# Patient Record
Sex: Male | Born: 1945 | Race: White | Hispanic: No | Marital: Married | Smoking: Former smoker
Health system: Southern US, Community
[De-identification: ages and names within clinical notes are randomized; demographics above are authoritative.]

## PROBLEM LIST (undated history)

## (undated) DIAGNOSIS — J84112 Idiopathic pulmonary fibrosis: Secondary | ICD-10-CM

## (undated) DIAGNOSIS — I10 Essential (primary) hypertension: Secondary | ICD-10-CM

## (undated) DIAGNOSIS — I4891 Unspecified atrial fibrillation: Secondary | ICD-10-CM

## (undated) DIAGNOSIS — I2510 Atherosclerotic heart disease of native coronary artery without angina pectoris: Secondary | ICD-10-CM

## (undated) HISTORY — PX: COLONOSCOPY: SHX174

## (undated) HISTORY — PX: UVULECTOMY: SHX2631

## (undated) HISTORY — PX: FOOT SURGERY: SHX648

## (undated) HISTORY — PX: CORONARY ANGIOPLASTY WITH STENT PLACEMENT: SHX49

## (undated) HISTORY — PX: RHINOPLASTY: SUR1284

## (undated) HISTORY — PX: BACK SURGERY: SHX140

## (undated) HISTORY — PX: VASECTOMY: SHX75

---

## 2004-06-20 ENCOUNTER — Encounter: Admission: RE | Admit: 2004-06-20 | Discharge: 2004-06-20 | Payer: Self-pay | Admitting: Otolaryngology

## 2010-03-22 DIAGNOSIS — I252 Old myocardial infarction: Secondary | ICD-10-CM

## 2010-06-16 ENCOUNTER — Emergency Department (HOSPITAL_BASED_OUTPATIENT_CLINIC_OR_DEPARTMENT_OTHER): Admission: EM | Admit: 2010-06-16 | Discharge: 2010-06-16 | Payer: Self-pay | Admitting: Emergency Medicine

## 2010-06-16 ENCOUNTER — Ambulatory Visit: Payer: Self-pay | Admitting: Diagnostic Radiology

## 2011-01-06 LAB — CBC
HCT: 48.1 % (ref 39.0–52.0)
MCH: 31.1 pg (ref 26.0–34.0)
MCHC: 33.8 g/dL (ref 30.0–36.0)
MCV: 92.1 fL (ref 78.0–100.0)
Platelets: 156 10*3/uL (ref 150–400)
RBC: 5.23 MIL/uL (ref 4.22–5.81)

## 2011-01-06 LAB — COMPREHENSIVE METABOLIC PANEL
Alkaline Phosphatase: 74 U/L (ref 39–117)
BUN: 20 mg/dL (ref 6–23)
CO2: 28 mEq/L (ref 19–32)
Chloride: 104 mEq/L (ref 96–112)
Creatinine, Ser: 1.1 mg/dL (ref 0.4–1.5)
GFR calc non Af Amer: >60 mL/min (ref >60)
Potassium: 3.9 mEq/L (ref 3.5–5.1)
Total Protein: 7.6 g/dL (ref 6.0–8.3)

## 2011-01-06 LAB — POCT CARDIAC MARKERS: Troponin i, poc: 0.06 ng/mL (ref 0.00–0.09)

## 2019-12-14 ENCOUNTER — Ambulatory Visit: Payer: Medicare Other | Attending: Internal Medicine

## 2019-12-14 DIAGNOSIS — Z23 Encounter for immunization: Secondary | ICD-10-CM

## 2019-12-14 NOTE — Progress Notes (Signed)
   Covid-19 Vaccination Clinic  Name:  Roger Sims    MRN: [id number] DOB: Apr 02, 1946  12/14/2019  Mr. Roger Sims was observed post Covid-19 immunization for 15 minutes without incidence. He was provided with Vaccine Information Sheet and instruction to access the V-Safe system.   Mr. Roger Sims was instructed to call 911 with any severe reactions post vaccine: Marland Kitchen Difficulty breathing  . Swelling of your face and throat  . A fast heartbeat  . A bad rash all over your body  . Dizziness and weakness    Immunizations Administered    Name Date Dose VIS Date Route   Pfizer COVID-19 Vaccine 12/14/2019  9:11 AM 0.3 mL 10/03/2019 Intramuscular   Manufacturer: [place]   Lot: [id number]   [place]: [id number]

## 2020-01-06 ENCOUNTER — Ambulatory Visit: Payer: Medicare Other | Attending: Internal Medicine

## 2020-01-06 DIAGNOSIS — Z23 Encounter for immunization: Secondary | ICD-10-CM

## 2020-01-06 NOTE — Progress Notes (Signed)
   Covid-19 Vaccination Clinic  Name:  Roger Sims    MRN: [id number] DOB: November 29, 1945  01/06/2020  [name] was observed post Covid-19 immunization for 15 minutes without incident. He was provided with Vaccine Information Sheet and instruction to access the V-Safe system.   [name] was instructed to call 911 with any severe reactions post vaccine: Marland Kitchen Difficulty breathing  . Swelling of face and throat  . A fast heartbeat  . A bad rash all over body  . Dizziness and weakness   Immunizations Administered    Name Date Dose VIS Date Route   Pfizer COVID-19 Vaccine 01/06/2020  3:51 PM 0.3 mL 10/03/2019 Intramuscular   Manufacturer: [place]   Lot: [id number]   [place]: [id number]

## 2020-07-08 ENCOUNTER — Ambulatory Visit: Payer: Medicare Other | Admitting: Internal Medicine

## 2020-07-08 ENCOUNTER — Encounter: Payer: Self-pay | Admitting: Internal Medicine

## 2020-07-08 ENCOUNTER — Other Ambulatory Visit: Payer: Self-pay

## 2020-07-08 ENCOUNTER — Other Ambulatory Visit: Payer: Self-pay | Admitting: Internal Medicine

## 2020-07-08 ENCOUNTER — Telehealth: Payer: Self-pay | Admitting: Internal Medicine

## 2020-07-08 ENCOUNTER — Telehealth: Payer: Self-pay | Admitting: Pharmacy Technician

## 2020-07-08 VITALS — BP 120/68 | HR 70 | Temp 96.2°F | Ht 72.0 in | Wt 171.8 lb

## 2020-07-08 DIAGNOSIS — J439 Emphysema, unspecified: Secondary | ICD-10-CM

## 2020-07-08 DIAGNOSIS — J84112 Idiopathic pulmonary fibrosis: Secondary | ICD-10-CM

## 2020-07-08 DIAGNOSIS — R634 Abnormal weight loss: Secondary | ICD-10-CM

## 2020-07-08 DIAGNOSIS — J9611 Chronic respiratory failure with hypoxia: Secondary | ICD-10-CM | POA: Diagnosis not present

## 2020-07-08 LAB — SEDIMENTATION RATE: Sed Rate: 15 mm/hr (ref 0–20)

## 2020-07-08 MED ORDER — SPIRIVA RESPIMAT 1.25 MCG/ACT IN AERS: 2.0000 | INHALATION_SPRAY | Freq: Every day | RESPIRATORY_TRACT | 0 refills | Status: DC

## 2020-07-08 NOTE — Telephone Encounter (Signed)
Forgot to prescrube him Spiriva Respimat 2 puffs once daily for associated emphysema

## 2020-07-08 NOTE — Progress Notes (Addendum)
OV 07/08/2020  Subjective:  Patient ID: Roger Sims, male , DOB: 05-Nov-1945 , age 74 y.o. , MRN: [id number] , ADDRESS: [address] [place] [address]   07/08/2020 -   Chief Complaint  Patient presents with  . Follow-up    pt is here for 2nd opinion losing weight.pt has pain left side .pt has issue with lungs.pt has extreme sob before 0xygen     HPI Roger Sims 74 y.o. -presents to th [place] center.  History is provided by him and his wife and review of the chart.  1 year ago he started losing weight.  Since then he is lost 20+ pounds.  Some 8 months ago started getting worsening chronic cough.  He is known to have chronic cough and sinus issues.  In 6 months ago worsening shortness of breath with insidious onset.  Then in June 2021/July 2021 he started having right-sided infra axillary pain.  At that time gallbladder issues was investigated a chest x-ray resulted in a CT scan of the chest which showed ILD.  He was empirically commenced on oxygen.  They feel is progressively getting worse.  Wife also noticed memory issues the last 1 year.  After starting the oxygen his memory issues started getting better but now is getting worse again.  After the CT scan of the chest was done ILD was diagnosed.  He was then asked to go see pulmonary specialist at [facility].  However his neighbor Shaune Leeks -suffers from IPF.  He is part of the support group.  He sees Dr. Dorothyann Peng at [facility].  This patient is now on hospice care and recommended patient see me in the for the patient is here at the [facility] health ILD center.    Integrated Comprehensive ILD Questionnaire  Symptoms:  As abiove and below.  He also has right-sided chest pain in the infra axillary region it is sharp.  Is present in the mornings.  It is there with movement and drinking water.  Slowly getting better since his original abrupt onset.  SYMPTOM SCALE - ILD 07/08/2020   O2 use RA  Shortness of Breath 0 -> 5  scale with 5 being worst (score 6 If unable to do)  At rest 1  Simple tasks - showers, clothes change, eating, shaving 2.5  Household (dishes, doing bed, laundry) NA  Shopping 2.5  Walking level at own pace 2.5  Walking up Stairs 3  Total (30-36) Dyspnea Score 11  How bad is your cough? 2.5  How bad is your fatigue 3  How bad is nausea 0  How bad is vomiting?  0  How bad is diarrhea? 0  How bad is anxiety? 2  How bad is depression 2.5      Past Medical History : Positive history for COPD this was diagnosed in the CT scan of the chest.  I reviewed the medical records.  There is mention.  Is not on any treatment for this.  He reported positive for mild kidney disease for the last few years.  He had a heart attack in 2011 and has a coronary artery stent.  Otherwise negative for collagen vascular disease or tuberculosis or vasculitis.   ROS: Positive for fatigue.  Positive for sicca symptoms.  Positive for 20 pound weight loss in the last several months.  Positive for excessive tiredness   FAMILY HISTORY of LUNG DISEASE: Completely denies   EXPOSURE HISTORY: Smokes cigarettes between 1962 and  1982.  20 cigarettes/day.  Currently not a smoker.  Does not smoke cigars.  No smoking pipes.  No passive smoking.  No vaping.  No marijuana use no cocaine use no intravenous drug use.   HOME and HOBBY DETAILS : Currently lives in a townhouse for the last 7 years.  Age of the townhome for 7 years.  Negative history for any organic antigen exposure in the house.  No dampness.  No mildew or shower curtain.  No humidifier.  No CPAP use no nebulizer use.  No steam iron use.  No Jacuzzi use.  No misting Fountain.  No pet birds or parakeets.  No pet gerbils.  No mold in the [facility] duct.  No music habits.  No gardening habits.   OCCUPATIONAL HISTORY (122 questions) : Exposure history for organic antigens he worked in [employer] he was in the [place] [employer].  He did airplane work and Furniture conservator/restorer work and.   While in the [place] was also exposed to gas fumes and chemicals.  Otherwise history is negative.   PULMONARY TOXICITY HISTORY (27 items): Denies.      CXR 2011  Narrative  Clinical Data: Chest pain and heaviness, right upper extremity  numbness, back pain.    PORTABLE CHEST - 1 VIEW 06/16/2010 1610 hours:    Comparison: None.    Findings: Moderate enlargement of the cardiac silhouette. Thoracic  aorta tortuous. Hilar and mediastinal contours otherwise  unremarkable. Lungs clear. Mild pulmonary venous hypertension  without overt edema. No visible pleural effusions. Prominent  paracardiac fat pad on the left.    IMPRESSION:  Cardiomegaly. No acute cardiopulmonary disease.   Provider: Karl Bales   He had a CT scan of the chest at [facility].  I was able to get our radiologist to visualize the CT scan.  They email me back.  Especially Dr. Madie Reno.  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  There does appear to be a mild craniocaudal gradient.  My impression would be "compatible with UIP" per current ATS guidelines.     Dan  ROS - per HPI     has no past medical history on file.   reports that he has quit smoking. His smoking use included cigarettes. He smoked 1.50 packs per day. He has quit using smokeless tobacco.   The histories are not reviewed yet. Please review them in the "History" navigator section and refresh this [place].  Allergies  Allergen Reactions  . Penicillins Swelling  . Tetracyclines & Related Anaphylaxis    Gi upset Gi upset      Immunization History  Administered Date(s) Administered  . PFIZER SARS-COV-2 Vaccination 12/14/2019, 01/06/2020    No family history on file.   Current Outpatient Medications:  .  latanoprost (XALATAN) 0.005 % ophthalmic solution, , Disp: , Rfl:  .  nebivolol (BYSTOLIC) 10 MG tablet, Take 1 tablet by mouth daily., Disp: , Rfl:  .  aspirin 81 MG EC tablet, Take by mouth., Disp: , Rfl:       Objective:   Vitals:   07/08/20 1034  BP: 120/68  Pulse: 70  Temp: (!) 96.2 F (35.7 C)  TempSrc: Oral  SpO2: 93%  Weight: 171 lb 12.8 oz (77.9 kg)  Height: 6' (1.829 m)    Estimated body mass index is 23.3 kg/m as calculated from the following:   Height as of this encounter: 6' (1.829 m).   Weight as of this encounter: 171 lb 12.8 oz (77.9 kg).  _0 @  Filed Weights   07/08/20 1034  Weight: 171 lb 12.8 oz (77.9 kg)     Physical Exam  General Appearance:    Alert, cooperative, no distress, appears stated age - yes , Deconditioned looking - yes , OBESE  - no, Sitting on Wheelchair -  no  Head:    Normocephalic, without obvious abnormality, atraumatic  Eyes:    PERRL, conjunctiva/corneas clear,  Ears:    Normal TM's and external ear canals, both ears  Nose:   Nares normal, septum midline, mucosa normal, no drainage    or sinus tenderness. OXYGEN ON  - yes . Patient is @ 3L   Throat:   Lips, mucosa, and tongue normal; teeth and gums normal. Cyanosis on lips - no  Neck:   Supple, symmetrical, trachea midline, no adenopathy;    thyroid:  no enlargement/tenderness/nodules; no carotid   bruit or JVD  Back:     Symmetric, no curvature, ROM normal, no CVA tenderness  Lungs:     Distress - no , Wheeze no, Barrell Chest - no, Purse lip breathing - no, Crackles -classic Velcro crackles in the lung base with a craniocaudal gradient  Chest Wall:    No tenderness or deformity.    Heart:    Regular rate and rhythm, S1 and S2 normal, no rub   or gallop, Murmur - no  Breast  Exam:    NOT DONE  Abdomen:     Soft, non-tender, bowel sounds active all four quadrants,    no masses, no organomegaly. Visceral obesity - no  Genitalia:   NOT DONE  Rectal:   NOT DONE  Extremities:   Extremities - normal, Has Cane - no, Clubbing - YES, Edema - no  Pulses:   2+ and symmetric all extremities  Skin:   Stigmata of Connective Tissue Disease - no  Lymph nodes:   Cervical, supraclavicular, and axillary nodes normal  Psychiatric:  Neurologic:   Pleasant - yes, Anxious - no, Flat affect - no  CAm-ICU - neg, Alert and Oriented x 3 - yes, Moves all 4s - yes, Speech - normal, Cognition - intact           Assessment:       ICD-10-CM   1. IPF (idiopathic pulmonary fibrosis) (HCC)  J84.112 Sed Rate (ESR)    Angiotensin converting enzyme    Antinuclear Antib (ANA)    ANA+ENA+DNA/DS+Scl 70+SjoSSA/B    Rheumatoid Factor    Cyclic citrul peptide antibody, IgG    CK Total (and CKMB)    Aldolase    Angi-Jo 1 antibody, IgG    Anti-Smith antibody    Hypersensitivity Pneumonitis    AMB referral to pulmonary rehabilitation    Hypersensitivity Pneumonitis    Anti-Smith antibody    Angi-Jo 1 antibody, IgG    Aldolase    CK Total (and CKMB)    Cyclic citrul peptide antibody, IgG    Rheumatoid Factor    ANA+ENA+DNA/DS+Scl 70+SjoSSA/B    Antinuclear Antib (ANA)    Angiotensin converting enzyme    Sed Rate (ESR)  2. Chronic respiratory failure with hypoxia (HCC)  J96.11 Sed Rate (ESR)    Angiotensin  converting enzyme    Antinuclear Antib (ANA)    ANA+ENA+DNA/DS+Scl 70+SjoSSA/B    Rheumatoid Factor    Cyclic citrul peptide antibody, IgG    CK Total (and CKMB)    Aldolase    Angi-Jo 1 antibody, IgG    Anti-Smith antibody    Hypersensitivity Pneumonitis    AMB referral to pulmonary rehabilitation    Hypersensitivity Pneumonitis    Anti-Smith antibody    Angi-Jo 1 antibody, IgG    Aldolase    CK Total (and CKMB)    Cyclic citrul peptide antibody, IgG    Rheumatoid  Factor    ANA+ENA+DNA/DS+Scl 70+SjoSSA/B    Antinuclear Antib (ANA)    Angiotensin converting enzyme    Sed Rate (ESR)  3. Weight loss, unintentional  R63.4 Sed Rate (ESR)    Angiotensin converting enzyme    Antinuclear Antib (ANA)    ANA+ENA+DNA/DS+Scl 70+SjoSSA/B    Rheumatoid Factor    Cyclic citrul peptide antibody, IgG    CK Total (and CKMB)    Aldolase    Angi-Jo 1 antibody, IgG    Anti-Smith antibody    Hypersensitivity Pneumonitis    AMB referral to pulmonary rehabilitation    Hypersensitivity Pneumonitis    Anti-Smith antibody    Angi-Jo 1 antibody, IgG    Aldolase    CK Total (and CKMB)    Cyclic citrul peptide antibody, IgG    Rheumatoid Factor    ANA+ENA+DNA/DS+Scl 70+SjoSSA/B    Antinuclear Antib (ANA)    Angiotensin converting enzyme    Sed Rate (ESR)   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  Previous cigarette smoking.  Male gender.  Caucasian ethnicity.  Age greater than 57.  Clubbing present.  Craniocaudal gradient Velcro crackles classic for UIP present.  No stigmata of connective tissue.  Based on all of this the diagnosis he has IPF especially with coexistent emphysema reported.     He has not had serology report.  He has had pulmonary function test with his reports are not available for me.  He had a CT scan of the chest but it was not read by our thoracic radiologist.  I am not able to visualize the image.  Despite all this is overwhelming pretest probably to hear his IPF.  Addendum: Radiologist got back saying the CT film is compatible with UIP.  Therefore this adds credence to IPF  Therefore we will go ahead and start treatment especially given his decline.  We discussed nintedanib versus pirfenidone.  Given his previous history of coronary artery disease even though he prefers diarrhea as a side effect compared to nausea we took a shared decision making to start pirfenidone.  We discussed the requirement for sunscreen frequently but  function test monitoring and monitoring for nausea and having to space out the drugs.  He is willing to do all this.  Plan:     Patient Instructions     ICD-10-CM   1. IPF (idiopathic pulmonary fibrosis) ([place])  J84.112   2. Chronic respiratory failure with hypoxia (HCC)  J96.11   3. Weight loss, unintentional  R63.4      Plan  -Sign release to get the CD-ROM of the high-resolution CT chest from [place] it with you at the next visit or drop it off -Spend 30 minutes and do the interstitial lung disease questionnaire today and drop it off at the front desk today itself - Do blood Serum: ESR, ACE, ANA, DS-DNA, RF,  anti-CCP, Total CK,  Aldolase,  scl-70, ssA, ssB, , anti-JO-1, anti-smith & Hypersensitivity Pneumonitis Panel  -Refer to pulmonary rehabilitation at [facility] medical center  - get in touch with Marlane Mingle at support group - [email or web address] - start ESBRIET per protocol - refer our pharmacist to counseling on esbreit - in future we discuss clinical trials -Continue oxygen 3 L nasal cannula and with exertion -try to keep pulse ox greater than 86% presents to the ILD center.  History is provided by wife, patient and also review of the chart.  It appears he has a lifelong history of chronic sinus issues and cough.  Then approximately 1 year ago he started losing weight.  Since then he is lost 20 pounds.  Followup  -Refer to see our pharmacist in the next few weeks -Return to see Dr. Chase Caller in the next 4-6 weeks and a 30-minute slot     ( Level 05 visit: Nw 60-74 min   in  visit type: on-site physical face to visit  in total care time and counseling or/and coordination of care by this undersigned MD - Dr Brand Males. This includes one or more of the following on this same day 07/08/2020: pre-charting, chart review, note writing, documentation discussion of test results, diagnostic or treatment recommendations, prognosis, risks and benefits of management  options, instructions, education, compliance or risk-factor reduction. It excludes time spent by the [place] or office staff in the care of the patient. Actual time 38 min)   SIGNATURE    Dr. Brand Males, M.D., F.C.C.P,  Pulmonary and Critical Care Medicine Staff Physician, [place] Director - Interstitial Lung Disease  Program  Pulmonary [place] at [place], [place], [address]  Pager: [phone], If no answer or between  15:00h - 7:00h: call [phone] Telephone: [phone]  2:41 PM 07/08/2020

## 2020-07-08 NOTE — Telephone Encounter (Signed)
thx

## 2020-07-08 NOTE — Telephone Encounter (Signed)
Pt spiriva respimat sent into pharmacy

## 2020-07-08 NOTE — Telephone Encounter (Signed)
Received notification from [facility] regarding a prior authorization for ESBRIET. Authorization has been APPROVED from 07/08/20 to 10/22/20.   Authorization # [id number]  Ran test claim for 1 month supply, patient's copay is $1,552.78. No pharmacy restrictions. No grants open at this time. Patient can apply for [employer] PAP. No paperwork has been received for patient.

## 2020-07-08 NOTE — Patient Instructions (Addendum)
ICD-10-CM   1. IPF (idiopathic pulmonary fibrosis) (HCC)  J84.112 Sed Rate (ESR)    Angiotensin converting enzyme    Antinuclear Antib (ANA)    ANA+ENA+DNA/DS+Scl 70+SjoSSA/B    Rheumatoid Factor    Cyclic citrul peptide antibody, IgG    CK Total (and CKMB)    Aldolase    Angi-Jo 1 antibody, IgG    Anti-Smith antibody    Hypersensitivity Pneumonitis    AMB referral to pulmonary rehabilitation    Hypersensitivity Pneumonitis    Anti-Smith antibody    Angi-Jo 1 antibody, IgG    Aldolase    CK Total (and CKMB)    Cyclic citrul peptide antibody, IgG    Rheumatoid Factor    ANA+ENA+DNA/DS+Scl 70+SjoSSA/B    Antinuclear Antib (ANA)    Angiotensin converting enzyme    Sed Rate (ESR)  2. Chronic respiratory failure with hypoxia (HCC)  J96.11 Sed Rate (ESR)    Angiotensin converting enzyme    Antinuclear Antib (ANA)    ANA+ENA+DNA/DS+Scl 70+SjoSSA/B    Rheumatoid Factor    Cyclic citrul peptide antibody, IgG    CK Total (and CKMB)    Aldolase    Angi-Jo 1 antibody, IgG    Anti-Smith antibody    Hypersensitivity Pneumonitis    AMB referral to pulmonary rehabilitation    Hypersensitivity Pneumonitis    Anti-Smith antibody    Angi-Jo 1 antibody, IgG    Aldolase    CK Total (and CKMB)    Cyclic citrul peptide antibody, IgG    Rheumatoid Factor    ANA+ENA+DNA/DS+Scl 70+SjoSSA/B    Antinuclear Antib (ANA)    Angiotensin converting enzyme    Sed Rate (ESR)  3. Weight loss, unintentional  R63.4 Sed Rate (ESR)    Angiotensin converting enzyme    Antinuclear Antib (ANA)    ANA+ENA+DNA/DS+Scl 70+SjoSSA/B    Rheumatoid Factor    Cyclic citrul peptide antibody, IgG    CK Total (and CKMB)    Aldolase    Angi-Jo 1 antibody, IgG    Anti-Smith antibody    Hypersensitivity Pneumonitis    AMB referral to pulmonary rehabilitation    Hypersensitivity Pneumonitis    Anti-Smith antibody    Angi-Jo 1 antibody, IgG    Aldolase    CK Total (and CKMB)    Cyclic citrul peptide antibody,  IgG    Rheumatoid Factor    ANA+ENA+DNA/DS+Scl 70+SjoSSA/B    Antinuclear Antib (ANA)    Angiotensin converting enzyme    Sed Rate (ESR)  4. Pulmonary emphysema, unspecified emphysema type (HCC)  J43.9      Plan  -Sign release to get the CD-ROM of the high-resolution CT chest from [place] it with you at the next visit or drop it off  -  Also written to a local radiologist if they consider imaging given your report.  -Spend 30 minutes and do the interstitial lung disease questionnaire today and drop it off at the front desk today itself - Do blood Serum: ESR, ACE, ANA, DS-DNA, RF, anti-CCP, Total CK,  Aldolase,  scl-70, ssA, ssB, , anti-JO-1, anti-smith & Hypersensitivity Pneumonitis Panel  -Refer to pulmonary rehabilitation at [facility] medical center  - get in touch with Marlane Mingle at support group - [email or web address] - start ESBRIET per protocol - refer our pharmacist to counseling on esbreit - in future we discuss clinical trials -Continue oxygen 3 L nasal cannula and with exertion -try to keep pulse ox greater than 86% presents to the ILD  center.  History is provided by wife, patient and also review of the chart.  It appears he has a lifelong history of chronic sinus issues and cough.  Then approximately 1 year ago he started losing weight.  Since then he is lost 20 pounds.   - will start spiriva - will call it in  Followup  -Refer to see our pharmacist in the next few weeks -Return to see Dr. Chase Caller in the next 4-6 weeks and a 30-minute slot

## 2020-07-08 NOTE — Telephone Encounter (Signed)
Submitted a Prior Authorization request to [facility] for [place] via Cover My Meds. Will update once we receive a response.   Key: [id number] - PA Case ID: [id number]

## 2020-07-09 ENCOUNTER — Telehealth: Payer: Self-pay | Admitting: Internal Medicine

## 2020-07-09 LAB — ANTI-SMITH ANTIBODY: ENA SM Ab Ser-aCnc: <1 AI

## 2020-07-09 NOTE — Telephone Encounter (Signed)
Patient has pharmacy visit scheduled for 9/28 @ 1:20pm

## 2020-07-09 NOTE — Telephone Encounter (Signed)
Spoke with the pt  He states that he is checking to be sure that ew sent off his release form for his disc of ct scan  [place]- you worked with MR 07/08/20 do you recall filling this out and faxing?

## 2020-07-12 LAB — CK TOTAL AND CKMB (NOT AT ARMC)
CK, MB: 3 ng/mL (ref 0–5.0)
Relative Index: 5.8 — ABNORMAL HIGH (ref 0–4.0)
Total CK: 52 U/L (ref 44–196)

## 2020-07-12 LAB — HYPERSENSITIVITY PNEUMONITIS
A. Pullulans Abs: NEGATIVE
A.Fumigatus #1 Abs: NEGATIVE
Micropolyspora faeni, IgG: NEGATIVE
Pigeon Serum Abs: NEGATIVE
Thermoact. Saccharii: NEGATIVE
Thermoactinomyces vulgaris, IgG: NEGATIVE

## 2020-07-12 LAB — ANA+ENA+DNA/DS+SCL 70+SJOSSA/B
ANA Titer 1: NEGATIVE
ENA RNP Ab: 0.2 AI (ref 0.0–0.9)
ENA SM Ab Ser-aCnc: <0.2 AI (ref 0.0–0.9)
ENA SSA (RO) Ab: <0.2 AI (ref 0.0–0.9)
ENA SSB (LA) Ab: <0.2 AI (ref 0.0–0.9)
Scleroderma (Scl-70) (ENA) Antibody, IgG: <0.2 AI (ref 0.0–0.9)
dsDNA Ab: <1 IU/mL (ref 0–9)

## 2020-07-12 LAB — ANA: Anti Nuclear Antibody (ANA): POSITIVE — AB

## 2020-07-12 LAB — ANTI-NUCLEAR AB-TITER (ANA TITER)
ANA TITER: 1:40 {titer} — ABNORMAL HIGH
ANA Titer 1: 1:40 {titer} — ABNORMAL HIGH

## 2020-07-12 LAB — CYCLIC CITRUL PEPTIDE ANTIBODY, IGG: Cyclic Citrullin Peptide Ab: 18 UNITS

## 2020-07-12 LAB — RHEUMATOID FACTOR: Rhuematoid fact SerPl-aCnc: 15 IU/mL — ABNORMAL HIGH (ref <14)

## 2020-07-12 LAB — ALDOLASE: Aldolase: 6.3 U/L (ref <=8.1)

## 2020-07-12 LAB — ANTI-JO 1 ANTIBODY, IGG: Anti JO-1: <0.2 AI (ref 0.0–0.9)

## 2020-07-12 LAB — ANGIOTENSIN CONVERTING ENZYME: Angiotensin-Converting Enzyme: 12 U/L (ref 9–67)

## 2020-07-13 NOTE — Telephone Encounter (Signed)
Yes it was completed in office and faxed off

## 2020-07-13 NOTE — Telephone Encounter (Signed)
Called and spoke to pt. Informed him that the form was completed and faxed back. Pt verbalized understanding and denied any further questions or concerns at this time.

## 2020-07-15 ENCOUNTER — Telehealth: Payer: Self-pay | Admitting: Internal Medicine

## 2020-07-15 NOTE — Telephone Encounter (Signed)
Patient is unsure why we contacted him. I looked and did not see any Documentation's in patein's chart regarding a call. Patient's voice was understanding. Nothing else further needed.

## 2020-07-16 NOTE — Progress Notes (Deleted)
HPI  Patient presents today to [facility] for Initial appt with pharmacy team for [place] counseling. Pertinent past medical history includes IPF, COPD, history of MI (2011), and history of tobacco abuse. He is naive to antifibrotic therapy.  OBJECTIVE Allergies  Allergen Reactions  . Penicillins Swelling  . Tetracyclines & Related Anaphylaxis    Gi upset Gi upset     Outpatient Encounter Medications as of 07/20/2020  Medication Sig  . aspirin 81 MG EC tablet Take by mouth.  . latanoprost (XALATAN) 0.005 % ophthalmic solution   . nebivolol (BYSTOLIC) 10 MG tablet Take 1 tablet by mouth daily.  . Tiotropium Bromide Monohydrate (SPIRIVA RESPIMAT) 1.25 MCG/ACT AERS Inhale 2 puffs into the lungs daily.   No facility-administered encounter medications on file as of 07/20/2020.     Immunization History  Administered Date(s) Administered  . PFIZER SARS-COV-2 Vaccination 12/14/2019, 01/06/2020     PFT's No results found for: FEV1, FVC, FEV1FVC, TLC, DLCO   CMP     Component Value Date/Time   NA 141 06/16/2010 1620   K 3.9 06/16/2010 1620   CL 104 06/16/2010 1620   CO2 28 06/16/2010 1620   GLUCOSE 123 (H) 06/16/2010 1620   BUN 20 06/16/2010 1620   CREATININE 1.1 06/16/2010 1620   CALCIUM 9.1 06/16/2010 1620   PROT 7.6 06/16/2010 1620   ALBUMIN 4.2 06/16/2010 1620   AST 34 06/16/2010 1620   ALT 29 06/16/2010 1620   ALKPHOS 74 06/16/2010 1620   BILITOT 0.8 06/16/2010 1620   GFRNONAA >60 06/16/2010 1620   GFRAA  06/16/2010 1620    >60        The eGFR has been calculated using the MDRD equation. This calculation has not been validated in all clinical situations. eGFR's persistently <60 mL/min signify possible Chronic Kidney Disease.     CBC    Component Value Date/Time   WBC 9.5 06/16/2010 1620   RBC 5.23 06/16/2010 1620   HGB 16.3 06/16/2010 1620   HCT 48.1 06/16/2010 1620   PLT 156 06/16/2010 1620   MCV 92.1 06/16/2010 1620   MCH 31.1 06/16/2010  1620   MCHC 33.8 06/16/2010 1620   RDW 13.5 06/16/2010 1620     LFT's Hepatic Function Latest Ref Rng & Units 06/16/2010  Total Protein 6.0 - 8.3 g/dL 7.6  Albumin 3.5 - 5.2 g/dL 4.2  AST 0 - 37 U/L 34  ALT 0 - 53 U/L 29  Alk Phosphatase 39 - 117 U/L 74  Total Bilirubin 0.3 - 1.2 mg/dL 0.8    ASSESSMENT  1. Esbriet Medication Management  Patient counseled on purpose, proper use, and potential adverse effects including nausea, vomiting, abdominal pain, GERD, weight loss, arthralgia, dizziness, and suns sensitivity/rash.  Stressed the importance of routine lab monitoring. Will monitor LFT's every month for the first 6 months of treatment then every 3 months. Will monitor CBC every 3 months.  Starting dose will be Esbriet 267 mg 1 tablet three times daily for 7 days, then 2 tablets three times daily for 7 days, then 3 tablets three times daily.  Maintenance dose will be 801 mg 1 tablet three times daily if tolerated.  Stressed the importance of taking with meals to minimize stomach upset.    Esbriet was approved through insurance but with co-pay of ~$1600 for a 30 day supply.  There are no grants open at this time.Will apply for patient assistance.  2. Medication Reconciliation  A drug regimen assessment was performed, including  review of allergies, interactions, disease-state management, dosing and immunization history. Medications were reviewed with the patient, including name, instructions, indication, goals of therapy, potential side effects, importance of adherence, and safe use.  Drug interaction(s): no major interactions identified  3. Immunizations  Patient is up to date with Prevnar 13 and Covid-19 vaccine.  Patient is due for annual influenza, repeat Pneumovax 23, and tetanus booster.  Previously received Zostavax but recommend new shingles vaccine, Shingrix.  PLAN ***  All questions encouraged and answered.  Instructed patient to call with any further questions or  concerns.  Thank you for allowing pharmacy to participate in this patient's care.  This appointment required  {CHL ONC TIME VISIT - QIWLN:[id number]} of patient care (this includes precharting, chart review, review of results, face-to-face care, etc.).

## 2020-07-20 ENCOUNTER — Other Ambulatory Visit: Payer: Medicare Other

## 2020-07-22 ENCOUNTER — Telehealth: Payer: Self-pay | Admitting: Internal Medicine

## 2020-07-22 NOTE — Telephone Encounter (Signed)
Called and spoke with both pt and wife stating that pt had labwork drawn after last OV and I did not see anything where pt was to have any more labwork done at this current time.  Pt stated at last OV, it was discussed for him to begin [place] and he was supposed to have had a pharmacy visit 9/28 but he had to cancel due to stomach issues.  Pt did take home patient assistance paperwork that he was to bring with him to that visit. Pt's wife said that they will drop paperwork off at the office tomorrow 10/1.  Pt and wife want to know if there is anything else that needs to be done on their end after they bring the paperwork back, if there is going to be a copay, etc.  Rachael, please advise. Also, if you could also call pt and wife to further discuss since their visit had to be cancelled, that would also be appreciated.

## 2020-07-22 NOTE — Telephone Encounter (Signed)
Called patient and discussed patient assistance forms. They will drop off at office tomorrow. Will reach out to them if anything else is needed.

## 2020-07-23 NOTE — Telephone Encounter (Signed)
Received patient's signed [place] documents, will place in MD box for signature.

## 2020-07-26 NOTE — Telephone Encounter (Signed)
Patient documents were received and placed in provider's box for signatures. Updates will be in previous encounter.

## 2020-07-30 NOTE — Telephone Encounter (Signed)
Submitted Patient Assistance Application to [place] for [place] along with provider portion and income documents. Will update patient when we receive a response.  Fax# [phone] Phone# [phone]

## 2020-08-02 ENCOUNTER — Encounter (HOSPITAL_COMMUNITY): Payer: Self-pay

## 2020-08-02 ENCOUNTER — Other Ambulatory Visit: Payer: Self-pay

## 2020-08-02 ENCOUNTER — Encounter (HOSPITAL_COMMUNITY)
Admission: RE | Admit: 2020-08-02 | Discharge: 2020-08-02 | Disposition: A | Discharge status: Home / Self Care | Payer: Medicare Other | Source: Ambulatory Visit | Attending: Internal Medicine | Admitting: Internal Medicine

## 2020-08-02 VITALS — BP 118/80 | HR 78 | Resp 16 | Ht 71.45 in | Wt 171.1 lb

## 2020-08-02 DIAGNOSIS — J84112 Idiopathic pulmonary fibrosis: Secondary | ICD-10-CM | POA: Insufficient documentation

## 2020-08-02 NOTE — Progress Notes (Signed)
Roger Sims 74 y.o. male Pulmonary Rehab Orientation Note Kaj who was referred to Pulmonary rehab by Dr. Chase Caller with the diagnosis of IPF arrived today in Cardiac and Pulmonary Rehab. He arrived ambulatory with slow gait accompanied by his wife due to memory issue. He does carry portable oxygen. [employer] is the provider for their MDE. Per pt, he uses oxygen continuously. Color good, skin warm and dry. Patient is oriented to time and place with pre dementia.  Pt is slow to responds and will repeat himself several times during the intake. Patient's medical history, psychosocial health, and medications reviewed. Psychosocial assessment reveals pt lives with their spouse. Pt is currently retired Company secretary. Pt hobbies include reading. Pt reports his stress level is average every day stress.  Although he admits that with new diagnosis of IPF has been hard to get his mind wrapped around.  Pt aware that presently there is no cure for this.Pt does not exhibit  signs of depression. Pt takes long naps during the day, however this is not new for him. PHQ2/9 score 0/4. Pt shows good  coping skills with positive outlook .  Will continue to monitor and evaluate progress toward psychosocial goal(s) of increased energy and stamina. Physical assessment reveals heart rate is normal, breath sounds  With characteristic for IPF moderate rhonchi/crackling heard both lower lobes. Grip strength equal, strong. Distal pulses palpable wit trace swelling to the left leg observed by wife. Patient reports he does take medications as prescribed. Patient states he follows a Regular diet with emphasis on vegetables and fruits.  Pt reports that leading up to this diagnosis he lost quite a bit of weight and prior weight loss after his cardiac event several years ago.  Pt would like to maintain his present weight or at least gain some.  He is doubtful he will get to his former weight. Patient's weight will be monitored closely. Pt wife remarked  that this was the fastest he has walked during the walk test.  At the 4 minute mark pt o2 sat decreased to 87 and his oxygen was increased to 4L.  Pt and his wife attributed to using the wheelchair as a cart with the oxygen tank as why he was able to walk the distance he did.  Pt felt he could walk faster because he was steady on his feet with the added support. . Demonstration and practice of PLB using pulse oximeter. Patient able to return demonstration satisfactorily. Safety and hand hygiene in the exercise area reviewed with patient. Patient voices understanding of the information reviewed. Department expectations discussed with patient and achievable goals were set. The patient shows enthusiasm about attending the program and we look forward to working with this nice gentleman. Lanny will begin exercise on 08/10/20.  45 minutes was spent on a variety of activities such as assessment of the patient, obtaining baseline data including height, weight, BMI, and grip strength, verifying medical history, allergies, and current medications, and teaching patient strategies for performing tasks with less respiratory effort with emphasis on pursed lip breathing. Cherre Huger, BSN Cardiac and Training and development officer

## 2020-08-02 NOTE — Progress Notes (Signed)
Pulmonary Individual Treatment Plan  Patient Details  Name: Roger Sims MRN: [id number] Date of Birth: [birth year] Referring Provider:    Initial Encounter Date:   Visit Diagnosis: IPF (idiopathic pulmonary fibrosis) ([facility])  Patient's Home Medications on Admission:   Current Outpatient Medications:  .  latanoprost (XALATAN) 0.005 % ophthalmic solution, , Disp: , Rfl:  .  nebivolol (BYSTOLIC) 10 MG tablet, Take 1 tablet by mouth daily., Disp: , Rfl:  .  Tiotropium Bromide Monohydrate (SPIRIVA RESPIMAT) 1.25 MCG/ACT AERS, Inhale 2 puffs into the lungs daily., Disp: 4 g, Rfl: 0 .  aspirin 81 MG EC tablet, Take by mouth. (Patient not taking: Reported on 08/02/2020), Disp: , Rfl:   Past Medical History: No past medical history on file.  Tobacco Use: Social History   Tobacco Use  Smoking Status Former Smoker  . Packs/day: 1.50  . Types: Cigarettes  Smokeless Tobacco Former Systems developer  Tobacco Comment   30 years ago    Labs: Recent Review Flowsheet Data   There is no flowsheet data to display.     Capillary Blood Glucose: No results found for: GLUCAP   Pulmonary Assessment Scores:  Pulmonary Assessment Scores    Row Name 08/02/20 1638         ADL UCSD   ADL Phase Entry     SOB Score total 52       CAT Score   CAT Score 20       mMRC Score   mMRC Score 3           UCSD: Self-administered rating of dyspnea associated with activities of daily living (ADLs) 6-point scale (0 = "not at all" to 5 = "maximal or unable to do because of breathlessness")  Scoring Scores range from 0 to 120.  Minimally important difference is 5 units  CAT: CAT can identify the health impairment of COPD patients and is better correlated with disease progression.  CAT has a scoring range of zero to 40. The CAT score is classified into four groups of low (less than 10), medium (10 - 20), high (21-30) and very high (31-40) based on the impact level of disease on health status. A CAT score over  10 suggests significant symptoms.  A worsening CAT score could be explained by an exacerbation, poor medication adherence, poor inhaler technique, or progression of COPD or comorbid conditions.  CAT MCID is 2 points  mMRC: mMRC (Modified Medical Research Council) Dyspnea Scale is used to assess the degree of baseline functional disability in patients of respiratory disease due to dyspnea. No minimal important difference is established. A decrease in score of 1 point or greater is considered a positive change.   Pulmonary Function Assessment:   Exercise Target Goals: Exercise Program Goal: Individual exercise prescription set using results from initial 6 min walk test and THRR while considering  patient's activity barriers and safety.   Exercise Prescription Goal: Initial exercise prescription builds to 30-45 minutes a day of aerobic activity, 2-3 days per week.  Home exercise guidelines will be given to patient during program as part of exercise prescription that the participant will acknowledge.  Activity Barriers & Risk Stratification:  Activity Barriers & Cardiac Risk Stratification - 08/02/20 1413      Activity Barriers & Cardiac Risk Stratification   Activity Barriers Shortness of Breath;Back Problems;Balance Concerns;Deconditioning    Cardiac Risk Stratification Moderate           6 Minute Walk:  6 Minute Walk  Row Name 08/02/20 1632         6 Minute Walk   Phase Initial     Distance 838 feet     Walk Time 6 minutes     # of Rest Breaks 0     MPH 1.59     METS 2.35     RPE 13     Perceived Dyspnea  3     VO2 Peak 8.24     Symptoms No     Resting HR 78 bpm     Resting BP 118/80     Resting Oxygen Saturation  98 %     Exercise Oxygen Saturation  during 6 min walk 87 %     Max Ex. HR 114 bpm     Max Ex. BP 132/80     2 Minute Post BP 110/70       Interval HR   1 Minute HR 82     2 Minute HR 95     3 Minute HR 94     4 Minute HR 92     5 Minute HR 108       6 Minute HR 114     2 Minute Post HR 75     Interval Heart Rate? Yes       Interval Oxygen   Interval Oxygen? Yes     Baseline Oxygen Saturation % 98 %     1 Minute Oxygen Saturation % 96 %     1 Minute Liters of Oxygen 3 L     2 Minute Oxygen Saturation % 90 %     2 Minute Liters of Oxygen 3 L     3 Minute Oxygen Saturation % 89 %     3 Minute Liters of Oxygen 3 L     4 Minute Oxygen Saturation % 87 %     4 Minute Liters of Oxygen 4 L     5 Minute Oxygen Saturation % 93 %     5 Minute Liters of Oxygen 4 L     6 Minute Oxygen Saturation % 92 %     6 Minute Liters of Oxygen 4 L     2 Minute Post Oxygen Saturation % 100 %     2 Minute Post Liters of Oxygen 3 L            Oxygen Initial Assessment:  Oxygen Initial Assessment - 08/02/20 1636      Initial 6 min Walk   Oxygen Used Continuous    Liters per minute 3      Program Oxygen Prescription   Program Oxygen Prescription Continuous      Intervention   Short Term Goals To learn and exhibit compliance with exercise, home and travel O2 prescription;To learn and understand importance of monitoring SPO2 with pulse oximeter and demonstrate accurate use of the pulse oximeter.;To learn and understand importance of maintaining oxygen saturations>88%;To learn and demonstrate proper pursed lip breathing techniques or other breathing techniques.;To learn and demonstrate proper use of respiratory medications    Long  Term Goals Exhibits compliance with exercise, home and travel O2 prescription;Verbalizes importance of monitoring SPO2 with pulse oximeter and return demonstration;Maintenance of O2 saturations>88%;Exhibits proper breathing techniques, such as pursed lip breathing or other method taught during program session;Compliance with respiratory medication;Demonstrates proper use of MDI's           Oxygen Re-Evaluation:  Oxygen Re-Evaluation    Row Name 08/02/20 1636  Program Oxygen Prescription   Liters per  minute 3              Oxygen Discharge (Final Oxygen Re-Evaluation):  Oxygen Re-Evaluation - 08/02/20 1636      Program Oxygen Prescription   Liters per minute 3           Initial Exercise Prescription:   Perform Capillary Blood Glucose checks as needed.  Exercise Prescription Changes:   Exercise Comments:   Exercise Goals and Review:  Exercise Goals    Row Name 08/02/20 1415             Exercise Goals   Increase Physical Activity Yes       Intervention Provide advice, education, support and counseling about physical activity/exercise needs.;Develop an individualized exercise prescription for aerobic and resistive training based on initial evaluation findings, risk stratification, comorbidities and participant's personal goals.       Expected Outcomes Short Term: Attend rehab on a regular basis to increase amount of physical activity.;Long Term: Exercising regularly at least 3-5 days a week.;Long Term: Add in home exercise to make exercise part of routine and to increase amount of physical activity.       Increase Strength and Stamina Yes       Intervention Provide advice, education, support and counseling about physical activity/exercise needs.;Develop an individualized exercise prescription for aerobic and resistive training based on initial evaluation findings, risk stratification, comorbidities and participant's personal goals.       Expected Outcomes Short Term: Increase workloads from initial exercise prescription for resistance, speed, and METs.;Short Term: Perform resistance training exercises routinely during rehab and add in resistance training at home;Long Term: Improve cardiorespiratory fitness, muscular endurance and strength as measured by increased METs and functional capacity (6MWT)       Able to understand and use rate of perceived exertion (RPE) scale Yes       Intervention Provide education and explanation on how to use RPE scale       Expected Outcomes  Short Term: Able to use RPE daily in rehab to express subjective intensity level;Long Term:  Able to use RPE to guide intensity level when exercising independently       Able to understand and use Dyspnea scale Yes       Intervention Provide education and explanation on how to use Dyspnea scale       Expected Outcomes Short Term: Able to use Dyspnea scale daily in rehab to express subjective sense of shortness of breath during exertion;Long Term: Able to use Dyspnea scale to guide intensity level when exercising independently       Knowledge and understanding of Target Heart Rate Range (THRR) Yes       Intervention Provide education and explanation of THRR including how the numbers were predicted and where they are located for reference       Expected Outcomes Short Term: Able to state/look up THRR;Long Term: Able to use THRR to govern intensity when exercising independently;Short Term: Able to use daily as guideline for intensity in rehab       Understanding of Exercise Prescription Yes       Intervention Provide education, explanation, and written materials on patient's individual exercise prescription       Expected Outcomes Short Term: Able to explain program exercise prescription;Long Term: Able to explain home exercise prescription to exercise independently              Exercise Goals Re-Evaluation :   Discharge  Exercise Prescription (Final Exercise Prescription Changes):   Nutrition:  Target Goals: Understanding of nutrition guidelines, daily intake of sodium <1541m, cholesterol <2030m calories 30% from fat and 7% or less from saturated fats, daily to have 5 or more servings of fruits and vegetables.  Biometrics:  Pre Biometrics - 08/02/20 1630      Pre Biometrics   Grip Strength 33 kg            Nutrition Therapy Plan and Nutrition Goals:   Nutrition Assessments:   Nutrition Goals Re-Evaluation:   Nutrition Goals Discharge (Final Nutrition Goals  Re-Evaluation):   Psychosocial: Target Goals: Acknowledge presence or absence of significant depression and/or stress, maximize coping skills, provide positive support system. Participant is able to verbalize types and ability to use techniques and skills needed for reducing stress and depression.  Initial Review & Psychosocial Screening:  Initial Psych Review & Screening - 08/02/20 1420      Initial Review   Current issues with None Identified      Family Dynamics   Good Support System? Yes      Barriers   Psychosocial barriers to participate in program There are no identifiable barriers or psychosocial needs.;The patient should benefit from training in stress management and relaxation.      Screening Interventions   Interventions Encouraged to exercise           Quality of Life Scores:  Scores of 19 and below usually indicate a poorer quality of life in these areas.  A difference of  2-3 points is a clinically meaningful difference.  A difference of 2-3 points in the total score of the Quality of Life Index has been associated with significant improvement in overall quality of life, self-image, physical symptoms, and general health in studies assessing change in quality of life.  PHQ-9: Recent Review Flowsheet Data    [facility] 08[facility]2020 08/02/2020   Decreased Interest 0 0   Down, Depressed, Hopeless 0 0   PHQ - 2 Score 0 0   Altered sleeping 1 -   Tired, decreased energy 0 -   Change in appetite 0 -   Feeling bad or failure about yourself  0 -   Trouble concentrating 1 -   Moving slowly or fidgety/restless 2 -   Suicidal thoughts 0 -   PHQ-9 Score 4 -   Difficult doing work/chores Not difficult at all -     Interpretation of Total Score  Total Score Depression Severity:  1-4 = Minimal depression, 5-9 = Mild depression, 10-14 = Moderate depression, 15-19 = Moderately severe depression, 20-27 = Severe depression   Psychosocial Evaluation and  Intervention:  Psychosocial Evaluation - 08/02/20 1421      Psychosocial Evaluation & Interventions   Interventions Stress management education;Encouraged to exercise with the program and follow exercise prescription;Relaxation education    Comments pt with supportive wife/caregiver strong faith community Pt has memory issues    Continue Psychosocial Services  Follow up required by staff           Psychosocial Re-Evaluation:   Psychosocial Discharge (Final Psychosocial Re-Evaluation):   Education: Education Goals: Education classes will be provided on a weekly basis, covering required topics. Participant will state understanding/return demonstration of topics presented.  Learning Barriers/Preferences:  Learning Barriers/Preferences - 08/02/20 1421      Learning Barriers/Preferences   Learning Barriers Hearing;Sight;Inability to learn new things   memory issues   Learning Preferences Verbal Instruction;Written Material;Individual Instruction  Education Topics: Risk Factor Reduction:  -Group instruction that is supported by a PowerPoint presentation. Instructor discusses the definition of a risk factor, different risk factors for pulmonary disease, and how the heart and lungs work together.     Nutrition for Pulmonary Patient:  -Group instruction provided by PowerPoint slides, verbal discussion, and written materials to support subject matter. The instructor gives an explanation and review of healthy diet recommendations, which includes a discussion on weight management, recommendations for fruit and vegetable consumption, as well as protein, fluid, caffeine, fiber, sodium, sugar, and alcohol. Tips for eating when patients are short of breath are discussed.   Pursed Lip Breathing:  -Group instruction that is supported by demonstration and informational handouts. Instructor discusses the benefits of pursed lip and diaphragmatic breathing and detailed demonstration on  how to preform both.     Oxygen Safety:  -Group instruction provided by PowerPoint, verbal discussion, and written material to support subject matter. There is an overview of "What is Oxygen" and "Why do we need it".  Instructor also reviews how to create a safe environment for oxygen use, the importance of using oxygen as prescribed, and the risks of noncompliance. There is a brief discussion on traveling with oxygen and resources the patient may utilize.   Oxygen Equipment:  -Group instruction provided by [facility] Staff utilizing handouts, written materials, and equipment demonstrations.   Signs and Symptoms:  -Group instruction provided by written material and verbal discussion to support subject matter. Warning signs and symptoms of infection, stroke, and heart attack are reviewed and when to call the physician/911 reinforced. Tips for preventing the spread of infection discussed.   Advanced Directives:  -Group instruction provided by verbal instruction and written material to support subject matter. Instructor reviews Advanced Directive laws and proper instruction for filling out document.   Pulmonary Video:  -Group video education that reviews the importance of medication and oxygen compliance, exercise, good nutrition, pulmonary hygiene, and pursed lip and diaphragmatic breathing for the pulmonary patient.   Exercise for the Pulmonary Patient:  -Group instruction that is supported by a PowerPoint presentation. Instructor discusses benefits of exercise, core components of exercise, frequency, duration, and intensity of an exercise routine, importance of utilizing pulse oximetry during exercise, safety while exercising, and options of places to exercise outside of rehab.     Pulmonary Medications:  -Verbally interactive group education provided by instructor with focus on inhaled medications and proper administration.   Anatomy and Physiology of the Respiratory System and  Intimacy:  -Group instruction provided by PowerPoint, verbal discussion, and written material to support subject matter. Instructor reviews respiratory cycle and anatomical components of the respiratory system and their functions. Instructor also reviews differences in obstructive and restrictive respiratory diseases with examples of each. Intimacy, Sex, and Sexuality differences are reviewed with a discussion on how relationships can change when diagnosed with pulmonary disease. Common sexual concerns are reviewed.   MD DAY -A group question and answer session with a medical doctor that allows participants to ask questions that relate to their pulmonary disease state.   OTHER EDUCATION -Group or individual verbal, written, or video instructions that support the educational goals of the pulmonary rehab program.   Holiday Eating Survival Tips:  -Group instruction provided by PowerPoint slides, verbal discussion, and written materials to support subject matter. The instructor gives patients tips, tricks, and techniques to help them not only survive but enjoy the holidays despite the onslaught of food that accompanies the holidays.   Knowledge  Questionnaire Score:  Knowledge Questionnaire Score - 08/02/20 1638      Knowledge Questionnaire Score   Pre Score 14/18           Core Components/Risk Factors/Patient Goals at Admission:  Personal Goals and Risk Factors at Admission - 08/02/20 1424      Core Components/Risk Factors/Patient Goals on Admission    Weight Management Weight Maintenance;Yes    Improve shortness of breath with ADL's Yes    Intervention Provide education, individualized exercise plan and daily activity instruction to help decrease symptoms of SOB with activities of daily living.    Expected Outcomes Short Term: Improve cardiorespiratory fitness to achieve a reduction of symptoms when performing ADLs;Long Term: Be able to perform more ADLs without symptoms or delay the  onset of symptoms    Lipids Yes    Intervention Provide education and support for participant on nutrition & aerobic/resistive exercise along with prescribed medications to achieve LDL <19m, HDL >47m   statin intolerance   Expected Outcomes Short Term: Participant states understanding of desired cholesterol values and is compliant with medications prescribed. Participant is following exercise prescription and nutrition guidelines.;Long Term: Cholesterol controlled with medications as prescribed, with individualized exercise RX and with personalized nutrition plan. Value goals: LDL < 7038mHDL > 40 mg.           Core Components/Risk Factors/Patient Goals Review:    Core Components/Risk Factors/Patient Goals at Discharge (Final Review):    ITP Comments:    Comments:

## 2020-08-05 ENCOUNTER — Ambulatory Visit: Payer: Medicare Other | Admitting: Internal Medicine

## 2020-08-05 ENCOUNTER — Encounter: Payer: Self-pay | Admitting: Internal Medicine

## 2020-08-05 ENCOUNTER — Telehealth: Payer: Self-pay | Admitting: Internal Medicine

## 2020-08-05 ENCOUNTER — Other Ambulatory Visit: Payer: Self-pay

## 2020-08-05 VITALS — BP 118/74 | HR 87 | Temp 97.2°F | Ht 72.0 in | Wt 174.0 lb

## 2020-08-05 DIAGNOSIS — R413 Other amnesia: Secondary | ICD-10-CM

## 2020-08-05 DIAGNOSIS — J209 Acute bronchitis, unspecified: Secondary | ICD-10-CM

## 2020-08-05 DIAGNOSIS — J84112 Idiopathic pulmonary fibrosis: Secondary | ICD-10-CM

## 2020-08-05 DIAGNOSIS — J439 Emphysema, unspecified: Secondary | ICD-10-CM

## 2020-08-05 DIAGNOSIS — R634 Abnormal weight loss: Secondary | ICD-10-CM

## 2020-08-05 MED ORDER — PREDNISONE 10 MG (21) PO TBPK: ORAL_TABLET | Freq: Every day | ORAL | 0 refills | Status: DC

## 2020-08-05 MED ORDER — LEVOFLOXACIN 500 MG PO TABS: 500.0000 mg | ORAL_TABLET | Freq: Every day | ORAL | 0 refills | Status: DC

## 2020-08-05 NOTE — Telephone Encounter (Signed)
Rachael  What is status update on Roger Sims? Family wishes to know  Thanks  MR

## 2020-08-05 NOTE — Progress Notes (Signed)
OV 07/08/2020  Subjective:  Patient ID: Roger Sims, male , DOB: 13-Aug-1946 , age 74 y.o. , MRN: [id number] , ADDRESS: [address] [place] [address]   07/08/2020 -   Chief Complaint  Patient presents with  . Follow-up    pt is here for 2nd opinion losing weight.pt has pain left side .pt has issue with lungs.pt has extreme sob before 0xygen     HPI Roger Sims 74 y.o. -presents to th [facility].  History is provided by him and his wife and review of the chart.  1 year ago he started losing weight.  Since then he is lost 20+ pounds.  Some 8 months ago started getting worsening chronic cough.  He is known to have chronic cough and sinus issues.  In 6 months ago worsening shortness of breath with insidious onset.  Then in June 2021/July 2021 he started having right-sided infra axillary pain.  At that time gallbladder issues was investigated a chest x-ray resulted in a CT scan of the chest which showed ILD.  He was empirically commenced on oxygen.  They feel is progressively getting worse.  Wife also noticed memory issues the last 1 year.  After starting the oxygen his memory issues started getting better but now is getting worse again.  After the CT scan of the chest was done ILD was diagnosed.  He was then asked to go see pulmonary specialist at [facility].  However his neighbor Shaune Leeks -suffers from IPF.  He is part of the support group.  He sees Dr. Dorothyann Peng at [facility].  This patient is now on hospice care and recommended patient see me in the for the patient is here at the [facility] health ILD center.   [place] Integrated Comprehensive ILD Questionnaire  Symptoms:  As abiove and below.  He also has right-sided chest pain in the infra axillary region it is sharp.  Is present in the mornings.  It is there with movement and drinking water.  Slowly getting better since his original abrupt onset.  SYMPTOM SCALE - ILD 07/08/2020   O2 use RA  Shortness of Breath 0 -> 5  scale with 5 being worst (score 6 If unable to do)  At rest 1  Simple tasks - showers, clothes change, eating, shaving 2.5  Household (dishes, doing bed, laundry) NA  Shopping 2.5  Walking level at own pace 2.5  Walking up Stairs 3  Total (30-36) Dyspnea Score 11  How bad is your cough? 2.5  How bad is your fatigue 3  How bad is nausea 0  How bad is vomiting?  0  How bad is diarrhea? 0  How bad is anxiety? 2  How bad is depression 2.5      Past Medical History : Positive history for COPD this was diagnosed in the CT scan of the chest.  I reviewed the medical records.  There is mention.  Is not on any treatment for this.  He reported positive for mild kidney disease for the last few years.  He had a heart attack in 2011 and has a coronary artery stent.  Otherwise negative for collagen vascular disease or tuberculosis or vasculitis.   ROS: Positive for fatigue.  Positive for sicca symptoms.  Positive for 20 pound weight loss in the last several months.  Positive for excessive tiredness   FAMILY HISTORY of LUNG DISEASE: Completely denies   EXPOSURE HISTORY: Smokes cigarettes between 1962 and 1982.  20 cigarettes/day.  Currently not a smoker.  Does not smoke cigars.  No smoking pipes.  No passive smoking.  No vaping.  No marijuana use no cocaine use no intravenous drug use.   HOME and HOBBY DETAILS : Currently lives in a townhouse for the last 7 years.  Age of the townhome for 7 years.  Negative history for any organic antigen exposure in the house.  No dampness.  No mildew or shower curtain.  No humidifier.  No CPAP use no nebulizer use.  No steam iron use.  No Jacuzzi use.  No misting Fountain.  No pet birds or parakeets.  No pet gerbils.  No mold in the [facility] duct.  No music habits.  No gardening habits.   OCCUPATIONAL HISTORY (122 questions) : Exposure history for organic antigens he worked in [employer] he was in the [place] War.  He did airplane work and Furniture conservator/restorer work and.   While in the [place] was also exposed to gas fumes and chemicals.  Otherwise history is negative.   PULMONARY TOXICITY HISTORY (27 items): Denies.   Pulmonary function test conducted 06/03/2020 [facility] shows minimal obstruction with mild restriction and severe decrease in diffusing capacity. He showed an insignificant response to bronchodilator. His ratio was 73%, FEV1 59%, FVC 60% with DLCO 34%. There appears to be an obstructive and restrictive pattern    CXR 2011  Narrative  Clinical Data: Chest pain and heaviness, right upper extremity  numbness, back pain.    PORTABLE CHEST - 1 VIEW 06/16/2010 1610 hours:    Comparison: None.    Findings: Moderate enlargement of the cardiac silhouette. Thoracic  aorta tortuous. Hilar and mediastinal contours otherwise  unremarkable. Lungs clear. Mild pulmonary venous hypertension  without overt edema. No visible pleural effusions. Prominent  paracardiac fat pad on the left.    IMPRESSION:  Cardiomegaly. No acute cardiopulmonary disease.   Provider: Karl Bales   He had a CT scan of the chest at [facility] health system.  I was able to get our radiologist to visualize the CT scan.  They email me back.  Especially Dr. Madie Reno.  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  There does appear to be a mild craniocaudal gradient.  My impression would be "compatible with UIP" per current ATS guidelines.     Dan  ROS - per HPI   OV 08/05/2020  Subjective:  Patient ID: Roger Sims, male , DOB: 02/06/1946 , age 61 y.o. , MRN: [id number] , ADDRESS: [address] [place] [address] PCP Thomes Dinning, MD Patient Care Team: Thomes Dinning, MD as PCP - General (Internal Medicine)  This Provider for this visit: Treatment Team:  Attending Provider: Brand Males, MD    08/05/2020 -   Chief Complaint  Patient presents with  . Follow-up    IPF, no issues     HPI Roger Sims 74 y.o. -returns for interstitial lung disease follow-up.  At the last visit after the left I was able to get Dr. Weber Cooks to review the radiology from [facility].  He is given a categorical diagnose of UIP.  He did not mention any  emphysema.  Patient is here with his wife.  They want to know if this is with emphysema because he pulmonary function test at [facility] showed low FVC.  However patient not find that Spiriva works.  I explained to them that I am unable to determine if there is presence of emphysema because of not able to visualize either the image of the PFT.  She says she will try to get the PFT.  I did advise that in the future we will be getting her own PFTs and CT scans as time goes on and will be able to address the emphysema component.  I did explain to them the current diagnosis IPF.  They do know a patient with IPF.  The patient is entering hospice at this point.  Patient does use oxygen.  Overall he is stable from last time.  Weight is stable.  Per the wife- she  reports on and off memory issues on the patient.  This is despite oxygen use.  When we walked him today at room air at rest he was fine but desaturated to labs.  He uses 3 L.  Of note for the last 10 days or so he is got early morning worsening cough with some green mucus.  No wheezing he has had his Covid vaccine   SYMPTOM SCALE - ILD 07/08/2020  08/05/2020 Last Weight  Most recent update: 08/05/2020  2:41 PM   Weight  78.9 kg (174 lb)             O2 use RA   Shortness of Breath 0 -> 5 scale with 5 being worst (score 6 If unable to  do)   At rest 1 0  Simple tasks - showers, clothes change, eating, shaving 2.5 2.5  Household (dishes, doing bed, laundry) NA 2.5  Shopping 2.5 2.5  Walking level at own pace 2.5 2.5  Walking up Stairs 3 3  Total (30-36) Dyspnea Score 11 13  How bad is your cough? 2.5 3  How bad is your fatigue 3 3  How bad is nausea 0 0  How bad is vomiting?  0 0  How bad is diarrhea? 0 1  How bad is anxiety? 2 2.5  How bad is depression 2.5 0   No flowsheet data found.  Simple office walk 185 feet x  3 laps goal with forehead probe 08/05/2020   O2 used ra  Number laps completed 2 of 3  Comments about pace slow  Resting Pulse Ox/HR 92% and 87/min  Final Pulse Ox/HR 85% and 110/min  Desaturated </= 88% yes  Desaturated <= 3% points yes  Got Tachycardic >/= 90/min yes  Symptoms at end of test Winded and dizzy and weak  Miscellaneous comments x      Results for Roger, Sims (MRN [id number]) as of 08/05/2020 14:49  Ref. Range 07/08/2020 11:42 07/08/2020 11:42 07/08/2020 11:44  CK Total Latest Ref Range: 44.0 - 196.0 U/L  52   CK, MB Latest Ref Range: 0 - 5.0 ng/mL  3.0   Aldolase Latest Ref Range: < OR = 8.1 U/L  6.3   Sed Rate Latest Ref Range: 0 - 20 mm/hr 15    Anti Nuclear Antibody (ANA) Latest Ref Range: NEGATIVE   POSITIVE (A)   ANA Pattern 1 Unknown  Cytoplasmic, Rods and Rings (A)   ANA Titer 1 Latest Units: titer Negative 1:40 (H)   Angiotensin-Converting Enzyme Latest Ref Range: 9 - 67 U/L  12   Anti JO-1 Latest Ref Range: 0.0 - 0.9 AI <1.8    Cyclic Citrullin Peptide Ab Latest Units: UNITS  18   dsDNA Ab Latest Ref Range: 0 - 9 IU/mL <1    ENA RNP Ab Latest Ref Range: 0.0 - 0.9 AI 0.2    ENA SSA (RO) Ab Latest Ref Range: 0.0 - 0.9 AI <0.2    ENA SSB (LA) Ab Latest Ref Range: 0.0 - 0.9 AI <0.2    RA Latex Turbid. Latest Ref Range: <14 IU/mL  15 (H)   ENA SM Ab Ser-aCnc Latest Ref Range: <1.0 NEG AI <0.2  <1.0 NEG  Scleroderma (Scl-70) (ENA) Antibody, IgG Latest Ref Range: 0.0  - 0.9 AI <0.2    A.Fumigatus #1 Abs Latest Ref Range: Negative  Negative    Micropolyspora faeni, IgG Latest Ref Range: Negative  Negative    Thermoactinomyces vulgaris, IgG Latest Ref Range: Negative  Negative    A. Pullulans Abs Latest Ref Range: Negative  Negative    Thermoact. Saccharii Latest Ref Range: Negative  Negative    Pigeon Serum Abs Latest Ref Range: Negative  Negative    ANA PATTERN Unknown  Nuclear, Speckled (A)   ANA TITER Latest Units: titer  1:40 (H)     ROS - per HPI     has no past medical history on file.   reports that he has quit smoking. His smoking use included cigarettes. He smoked 1.50 packs per day. He has quit using smokeless tobacco.  History reviewed. No pertinent surgical history.  Allergies  Allergen Reactions  . Penicillins Swelling  . Tetracyclines & Related Anaphylaxis    Gi upset Gi upset   . Metoprolol Tartrate Rash    itching itching   . Prasugrel Rash    Immunization History  Administered Date(s) Administered  . Influenza, High Dose Seasonal PF 07/28/2014, 07/20/2015, 07/01/2019  . PFIZER SARS-COV-2 Vaccination 12/14/2019, 01/06/2020  . Pneumococcal Conjugate-13 03/12/2015  . Pneumococcal Polysaccharide-23 08/30/2011  . Tdap 02/10/2009  . Zoster 02/15/2012    History reviewed. No pertinent family history.   Current Outpatient Medications:  .  aspirin 81 MG EC tablet, Take by mouth. , Disp: , Rfl:  .  latanoprost (XALATAN) 0.005 % ophthalmic solution, , Disp: , Rfl:  .  nebivolol (BYSTOLIC) 10 MG tablet, Take 1 tablet by mouth daily., Disp: , Rfl:  .  Tiotropium Bromide Monohydrate (SPIRIVA RESPIMAT) 1.25 MCG/ACT AERS, Inhale 2 puffs into the lungs daily., Disp: 4 g, Rfl: 0 .  ESBRIET 267 MG TABS, Take by mouth. (Patient not taking: Reported on 08/05/2020), Disp: , Rfl:  .  levofloxacin (LEVAQUIN) 500 MG tablet, Take 1 tablet (500 mg total) by mouth daily. Take 549m daily for 5 days, Disp: 5 tablet, Rfl: 0 .  predniSONE  (STERAPRED UNI-PAK 21 TAB) 10 MG (21) TBPK tablet, Take by mouth daily. Take 432mfor 2 days, 3030mor 2 days then 81m31mr 2 days then 10mg46m 2 days the 5mg f33m2 days then stop, Disp: 22 tablet, Rfl: 0      Objective:   Vitals:   08/05/20 1437  BP: 118/74  Pulse: 87  Temp: (!) 97.2 F (36.2 C)  TempSrc: Oral  SpO2: 92%  Weight: 174 lb (78.9 kg)  Height: 6' (1.829 m)    Estimated body mass index is 23.6 kg/m as calculated from the following:   Height as of this encounter: 6' (1.829 m).   Weight as of this  encounter: 174 lb (78.9 kg).  _0 @  Filed Weights   08/05/20 1437  Weight: 174 lb (78.9 kg)     Physical Exam   General: No distress.  Neuro: Alert and Oriented x 3. GCS 15. Speech normal Psych: Pleasant Resp:  Barrel Chest - no.  Wheeze - no, Crackles - yes velcro at base, No overt respiratory distress CVS: Normal heart sounds. Murmurs - no Ext: Stigmata of Connective Tissue Disease - no HEENT: Normal upper airway. PEERL +. No post nasal drip        Assessment:       ICD-10-CM   1. IPF (idiopathic pulmonary fibrosis) (HCC)  L27.517 Pulmonary function test  2. Pulmonary emphysema, unspecified emphysema type ([place])  J43.9 Pulmonary function test  3. Weight loss, unintentional  R63.4   4. Memory loss  R41.3   5. Acute bronchitis, unspecified organism  J20.9        Plan:     Patient Instructions  IPF (idiopathic pulmonary fibrosis) ([place])   - The diagnosis is IPF. Serology in my view Is negative  Plan  -Awaiting the start of pirfenidone.  I will check with the pharmacy technician as to the status of the approval process -Once you get the drug please started - use o2 at [facility] -Get Covid booster after prednisone antibiotics -Get high-dose flu shot after prednisone antibiotics  Pulmonary emphysema, unspecified emphysema type ([place])   -It is unclear to me if you have emphysema associated or not.  The radiologist did not comment about it the  breathing test at [facility] did comment about it.  It seems to Spiriva is not helping.  Regardless it is a fibrosis is a major component  Plan -At some point when we get our own breathing test and CT scan we can make that determination -Do albuterol as needed   Weight loss, unintentional  -We will keep an eye on this especially once you start pirfenidone  Memory loss  -Please talk to primary care physician and definitely get a referral to a neurologist  Acute bronchitis  -I think this is going on for the last 10 days  Plan -  take levaquin 550m once daily  X 5 days (you are allergic to PCN and doxy)  - Take prednisone 40 mg daily x 2 days, then 249mdaily x 2 days, then 1054maily x 2 days, then 5mg41mily x 2 days and stop  Followup - spirometry and dlco in 6 weeks -   - 6 weeks or sooner if needed for 30 min face to face visit with Dr RamaChase Caller  ( Level 05 visit: Estb 40-54 min  in  visit type: on-site physical face to visit  in total care time and counseling or/and coordination of care by this undersigned MD - Dr MuraBrand Malesis includes one or more of the following on this same day 08/05/2020: pre-charting, chart review, note writing, documentation discussion of test results, diagnostic or treatment recommendations, prognosis, risks and benefits of management options, instructions, education, compliance or risk-factor reduction. It excludes time spent by the [facility] [place] staff in the care of the patient. Actual time 40 min)   SIGNATURE    Dr. MuraBrand MalesD., F.C.C.P,  Pulmonary and Critical Care Medicine Staff Physician, [facility] Interstitial Lung Disease  Program  [place], [id number]: [phone]  no answer or between  15:00h - 7:00h: call [phone] Telephone: [phone]  5:32 PM 08/05/2020

## 2020-08-05 NOTE — Patient Instructions (Addendum)
IPF (idiopathic pulmonary fibrosis) (HCC)   - The diagnosis is IPF. Serology in my view Is negative  Plan  -Awaiting the start of pirfenidone.  I will check with the pharmacy technician as to the status of the approval process -Once you get the drug please started - use o2 at [facility] -Get Covid booster after prednisone antibiotics -Get high-dose flu shot after prednisone antibiotics  Pulmonary emphysema, unspecified emphysema type ([place])   -It is unclear to me if you have emphysema associated or not.  The radiologist did not comment about it the breathing test at [facility] did comment about it.  It seems to Spiriva is not helping.  Regardless it is a fibrosis is a major component  Plan -At some point when we get our own breathing test and CT scan we can make that determination -Do albuterol as needed   Weight loss, unintentional  -We will keep an eye on this especially once you start pirfenidone  Memory loss  -Please talk to primary care physician and definitely get a referral to a neurologist  Acute bronchitis  -I think this is going on for the last 10 days  Plan -  take levaquin 518m once daily  X 5 days (you are allergic to PCN and doxy)  - Take prednisone 40 mg daily x 2 days, then 258mdaily x 2 days, then 1015maily x 2 days, then 5mg67mily x 2 days and stop  Followup - spirometry and dlco in 6 weeks -   - 6 weeks or sooner if needed for 30 min face to face visit with Dr RamaChase Caller

## 2020-08-05 NOTE — Telephone Encounter (Signed)
Called [employer] to check status of application. Recording stated they are in a staff meeting. Will try back in the morning.  Phone# [phone]

## 2020-08-05 NOTE — Telephone Encounter (Signed)
Patient's application is currently in process with the manufacturer. I called [employer] this afternoon to check status, and received recording that they were in a staff meeting. Ill try back later or in the morning and will call patient to update.   Thanks! Kaion Tisdale

## 2020-08-06 NOTE — Telephone Encounter (Signed)
[employer], rep advised patient's rx was transferred to [facility] and they will make contact in the next 24-48 hours.  Called patient and provided pharmacy phone number- [phone]

## 2020-08-09 NOTE — Telephone Encounter (Signed)
Called [employer] to check patient's order status, patient is awaiting copay assistance. Will reach out to [facility] to follow up.

## 2020-08-10 ENCOUNTER — Other Ambulatory Visit: Payer: Self-pay

## 2020-08-10 ENCOUNTER — Encounter (HOSPITAL_COMMUNITY)
Admission: RE | Admit: 2020-08-10 | Discharge: 2020-08-10 | Disposition: A | Discharge status: Home / Self Care | Payer: Medicare Other | Source: Ambulatory Visit | Attending: Internal Medicine | Admitting: Internal Medicine

## 2020-08-10 DIAGNOSIS — J84112 Idiopathic pulmonary fibrosis: Secondary | ICD-10-CM | POA: Diagnosis not present

## 2020-08-10 NOTE — Telephone Encounter (Signed)
Patient was Approved today for [facility] patient assistance. Called patient and left message to advise.  Program will reach out to patient to set up 1st shipment.

## 2020-08-10 NOTE — Progress Notes (Signed)
Daily Session Note  Patient Details  Name: Roger Sims MRN: [id number] Date of Birth: Apr 04, 1946 Referring Provider:     Pulmonary Rehab Walk Test from 08/02/2020 in [place]  Referring Provider Dr. Chase Caller      Encounter Date: 08/10/2020  Check In:  Session Check In - 08/10/20 1503      Check-In   Supervising physician immediately available to respond to emergencies [facility] immediately available    Physician(s) Dr. Teryl Lucy    Location [facility]    Staff Present Maurice Small, RN, BSN;Cecilia Nishikawa Ysidro Evert, RN;Jessica Hassell Done, MS, ACSM-CEP, Exercise Physiologist    Virtual Visit No    Medication changes reported     No    Fall or balance concerns reported    No    Tobacco Cessation No Change    Warm-up and Cool-down Performed on first and last piece of equipment    Resistance Training Performed Yes    VAD Patient? No    PAD/SET Patient? No      Pain Assessment   Currently in Pain? No/denies    Multiple Pain Sites No           Capillary Blood Glucose: No results found for this or any previous visit (from the past 24 hour(s)).    Social History   Tobacco Use  Smoking Status Former Smoker  . Packs/day: 1.50  . Types: Cigarettes  Smokeless Tobacco Former Systems developer  Tobacco Comment   30 years ago    Goals Met:  Exercise tolerated well No report of cardiac concerns or symptoms Strength training completed today  Goals Unmet:  Not Applicable  Comments: Service time is from 1320 to 71    Dr. Fransico Him is Medical Director for Cardiac Rehab at [facility].

## 2020-08-10 NOTE — Telephone Encounter (Signed)
Received a fax from  [place] regarding an Approval for [facility] patient assistance from 08/10/20 until further notice.   Phone number: [phone]  Called patient and left message to advise. Called [facility] rx to advise also.

## 2020-08-12 ENCOUNTER — Encounter (HOSPITAL_COMMUNITY)
Admission: RE | Admit: 2020-08-12 | Discharge: 2020-08-12 | Disposition: A | Discharge status: Home / Self Care | Payer: Medicare Other | Source: Ambulatory Visit | Attending: Internal Medicine | Admitting: Internal Medicine

## 2020-08-12 ENCOUNTER — Other Ambulatory Visit: Payer: Self-pay

## 2020-08-12 DIAGNOSIS — J84112 Idiopathic pulmonary fibrosis: Secondary | ICD-10-CM

## 2020-08-12 NOTE — Progress Notes (Signed)
Daily Session Note  Patient Details  Name: Roger Sims MRN: [id number] Date of Birth: 07-19-1946 Referring Provider:     Pulmonary Rehab Walk Test from 08/02/2020 in [place]  Referring Provider Dr. Chase Caller      Encounter Date: 08/12/2020  Check In:  Session Check In - 08/12/20 1350      Check-In   Supervising physician immediately available to respond to emergencies [facility] immediately available    Physician(s) Dr. Dessa Phi    Location [facility]    Staff Present Maurice Small, RN, BSN;Lisa Ysidro Evert, RN;Kemi Gell Hassell Done, MS, ACSM-CEP, Exercise Physiologist    Virtual Visit No    Medication changes reported     No    Fall or balance concerns reported    No    Tobacco Cessation No Change    Warm-up and Cool-down Performed on first and last piece of equipment    Resistance Training Performed Yes    VAD Patient? No    PAD/SET Patient? No      Pain Assessment   Currently in Pain? No/denies    Pain Score 0-No pain    Multiple Pain Sites No           Capillary Blood Glucose: No results found for this or any previous visit (from the past 24 hour(s)).    Social History   Tobacco Use  Smoking Status Former Smoker  . Packs/day: 1.50  . Types: Cigarettes  Smokeless Tobacco Former Systems developer  Tobacco Comment   30 years ago    Goals Met:  Proper associated with RPD/PD & O2 Sat Exercise tolerated well No report of cardiac concerns or symptoms Strength training completed today  Goals Unmet:  Not Applicable  Comments: Service time is from 1320 to 1442    Dr. Fransico Him is Medical Director for Cardiac Rehab at [facility].

## 2020-08-17 ENCOUNTER — Other Ambulatory Visit: Payer: Self-pay

## 2020-08-17 ENCOUNTER — Encounter (HOSPITAL_COMMUNITY)
Admission: RE | Admit: 2020-08-17 | Discharge: 2020-08-17 | Disposition: A | Discharge status: Home / Self Care | Payer: Medicare Other | Source: Ambulatory Visit | Attending: Internal Medicine | Admitting: Internal Medicine

## 2020-08-17 DIAGNOSIS — J84112 Idiopathic pulmonary fibrosis: Secondary | ICD-10-CM

## 2020-08-17 NOTE — Progress Notes (Signed)
Daily Session Note  Patient Details  Name: KAEO JACOME MRN: [id number] Date of Birth: [birth year] Referring Provider:     Pulmonary Rehab Walk Test from 08/02/2020 in [place]  Referring Provider Dr. Chase Caller      Encounter Date: 08/17/2020  Check In:  Session Check In - 08/17/20 1335      Check-In   Supervising physician immediately available to respond to emergencies [facility] immediately available    Physician(s) Dr. Dessa Phi    Location [facility]    Staff Present Maurice Small, RN, BSN;Amantha Sklar Ysidro Evert, RN;Jessica Hassell Done, MS, ACSM-CEP, Exercise Physiologist    Virtual Visit No    Medication changes reported     No    Fall or balance concerns reported    No    Tobacco Cessation No Change    Warm-up and Cool-down Performed on first and last piece of equipment    Resistance Training Performed Yes    VAD Patient? No    PAD/SET Patient? No      Pain Assessment   Currently in Pain? No/denies    Pain Score 0-No pain    Multiple Pain Sites No           Capillary Blood Glucose: No results found for this or any previous visit (from the past 24 hour(s)).    Social History   Tobacco Use  Smoking Status Former Smoker  . Packs/day: 1.50  . Types: Cigarettes  Smokeless Tobacco Former Systems developer  Tobacco Comment   30 years ago    Goals Met:  Exercise tolerated well No report of cardiac concerns or symptoms Strength training completed today  Goals Unmet:  Not Applicable  Comments: Service time is from 1325 to 1430    Dr. Fransico Him is Medical Director for Cardiac Rehab at [facility].

## 2020-08-19 ENCOUNTER — Other Ambulatory Visit: Payer: Self-pay

## 2020-08-19 ENCOUNTER — Encounter (HOSPITAL_COMMUNITY)
Admission: RE | Admit: 2020-08-19 | Discharge: 2020-08-19 | Disposition: A | Discharge status: Home / Self Care | Payer: Medicare Other | Source: Ambulatory Visit | Attending: Internal Medicine | Admitting: Internal Medicine

## 2020-08-19 VITALS — Wt 165.0 lb

## 2020-08-19 DIAGNOSIS — J84112 Idiopathic pulmonary fibrosis: Secondary | ICD-10-CM | POA: Diagnosis not present

## 2020-08-19 NOTE — Progress Notes (Signed)
Roger Sims 74 y.o. male Nutrition Note  Visit Diagnosis: IPF (idiopathic pulmonary fibrosis) ([place])  No past medical history on file.   Medications reviewed.   Current Outpatient Medications:  .  aspirin 81 MG EC tablet, Take by mouth. , Disp: , Rfl:  .  ESBRIET 267 MG TABS, Take by mouth. (Patient not taking: Reported on 08/05/2020), Disp: , Rfl:  .  latanoprost (XALATAN) 0.005 % ophthalmic solution, , Disp: , Rfl:  .  levofloxacin (LEVAQUIN) 500 MG tablet, Take 1 tablet (500 mg total) by mouth daily. Take 558m daily for 5 days, Disp: 5 tablet, Rfl: 0 .  nebivolol (BYSTOLIC) 10 MG tablet, Take 1 tablet by mouth daily., Disp: , Rfl:  .  predniSONE (STERAPRED UNI-PAK 21 TAB) 10 MG (21) TBPK tablet, Take by mouth daily. Take 428mfor 2 days, 3068mor 2 days then 56m61mr 2 days then 10mg69m 2 days the 5mg f59m2 days then stop, Disp: 22 tablet, Rfl: 0 .  Tiotropium Bromide Monohydrate (SPIRIVA RESPIMAT) 1.25 MCG/ACT AERS, Inhale 2 puffs into the lungs daily., Disp: 4 g, Rfl: 0   Ht Readings from Last 1 Encounters:  08/05/20 6' (1.829 m)     Wt Readings from Last 3 Encounters:  08/05/20 174 lb (78.9 kg)  08/02/20 171 lb 1.2 oz (77.6 kg)  07/08/20 171 lb 12.8 oz (77.9 kg)     There is no height or weight on file to calculate BMI.   Social History   Tobacco Use  Smoking Status Former Smoker  . Packs/day: 1.50  . Types: Cigarettes  Smokeless Tobacco Former User  Systems [employer] Comment   30 years ago      Nutrition Note  Spoke with pt. Nutrition Plan and Nutrition Survey goals reviewed with pt.   Acid reflux: Pt did not report Appetite: good Unintentional weight loss: Purposefully started losing weight last year. Continued to unintentionally lose weight when diagnosed with IPF. He states weight is stable and not an issue. He wants to maintain. Difficulty eating: none. He grocery shops and cooks and has no difficulty. He rests when needed. Feels best in the morning for  ADL's. Protein: adequate, eating well balanced meals  Fluids: water and 1/2 and 1/2 sweet tea. He is reduced sugar in his tea. Uncertain of how much fluid is drinks.  Meals per day: 3 Reading labels. Not salting foods.  Eating out 1-2 times per week  Pt expressed understanding of the information reviewed.   Nutrition Diagnosis ? Food-and nutrition-related knowledge deficit related to lack of exposure to information as related to diagnosis of: ? IPF  Nutrition Intervention ? Pt's individual nutrition plan reviewed with pt. ? Benefits of adopting healthy diet reviewed with Rate My Plate survey ? Continue client-centered nutrition education by RD, as part of interdisciplinary care.  Goal(s)  ? Pt to build a healthy plate including vegetables, fruits, whole grains, and low-fat dairy products in a heart healthy meal plan. ? Pt to maintain his weight   Plan:   Will provide client-centered nutrition education as part of interdisciplinary care  Monitor and evaluate progress toward nutrition goal with team.   MerediMichaele OfferRDN, LDN

## 2020-08-19 NOTE — Progress Notes (Signed)
Daily Session Note  Patient Details  Name: Roger Sims MRN: [id number] Date of Birth: 06-14-1946 Referring Provider:     Pulmonary Rehab Walk Test from 08/02/2020 in [facility]  Referring Provider Dr. Chase Caller      Encounter Date: 08/19/2020  Check In:  Session Check In - 08/19/20 1420      Check-In   Supervising physician immediately available to respond to emergencies [facility] immediately available    Physician(s) Dr. Darrick Meigs    Location [facility]    Staff Present Rosebud Poles, RN, BSN;Lisa Ysidro Evert, RN;Jazzie Trampe Hassell Done, MS, ACSM-CEP, Exercise Physiologist    Virtual Visit No    Medication changes reported     No    Fall or balance concerns reported    No    Tobacco Cessation No Change    Warm-up and Cool-down Performed on first and last piece of equipment    Resistance Training Performed Yes    VAD Patient? No    PAD/SET Patient? No      Pain Assessment   Currently in Pain? No/denies    Pain Score 0-No pain    Multiple Pain Sites No           Capillary Blood Glucose: No results found for this or any previous visit (from the past 24 hour(s)).    Social History   Tobacco Use  Smoking Status Former Smoker  . Packs/day: 1.50  . Types: Cigarettes  Smokeless Tobacco Former Systems developer  Tobacco Comment   30 years ago    Goals Met:  Proper associated with RPD/PD & O2 Sat Exercise tolerated well No report of cardiac concerns or symptoms Strength training completed today  Goals Unmet:  Not Applicable  Comments: Service time is from 1325 to 64    Dr. Fransico Him is Medical Director for Cardiac Rehab at [facility].

## 2020-08-19 NOTE — Progress Notes (Signed)
Pulmonary Individual Treatment Plan  Patient Details  Name: PERVIS MACINTYRE MRN: [id number] Date of Birth: 08/14/1946 Referring Provider:     Pulmonary Rehab Walk Test from 08/02/2020 in [place]  Referring Provider Dr. Chase Caller      Initial Encounter Date:    Pulmonary Rehab Walk Test from 08/02/2020 in [place]  Date 08/02/20      Visit Diagnosis: IPF (idiopathic pulmonary fibrosis) ([place])  Patient's Home Medications on Admission:   Current Outpatient Medications:  .  aspirin 81 MG EC tablet, Take by mouth. , Disp: , Rfl:  .  ESBRIET 267 MG TABS, Take by mouth. (Patient not taking: Reported on 08/05/2020), Disp: , Rfl:  .  latanoprost (XALATAN) 0.005 % ophthalmic solution, , Disp: , Rfl:  .  levofloxacin (LEVAQUIN) 500 MG tablet, Take 1 tablet (500 mg total) by mouth daily. Take 564m daily for 5 days, Disp: 5 tablet, Rfl: 0 .  nebivolol (BYSTOLIC) 10 MG tablet, Take 1 tablet by mouth daily., Disp: , Rfl:  .  predniSONE (STERAPRED UNI-PAK 21 TAB) 10 MG (21) TBPK tablet, Take by mouth daily. Take 436mfor 2 days, 3032mor 2 days then 68m38mr 2 days then 10mg60m 2 days the 5mg f26m2 days then stop, Disp: 22 tablet, Rfl: 0 .  Tiotropium Bromide Monohydrate (SPIRIVA RESPIMAT) 1.25 MCG/ACT AERS, Inhale 2 puffs into the lungs daily., Disp: 4 g, Rfl: 0  Past Medical History: No past medical history on file.  Tobacco Use: Social History   Tobacco Use  Smoking Status Former Smoker  . Packs/day: 1.50  . 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     Capillary Blood Glucose: No results found for: GLUCAP   Pulmonary Assessment Scores:  Pulmonary Assessment Scores    Row Name 08/02/20 1638         ADL UCSD   ADL Phase Entry     SOB Score total 52       CAT Score   CAT Score 20        mMRC Score   mMRC Score 3           UCSD: Self-administered rating of dyspnea associated with activities of daily living (ADLs) 6-point scale (0 = "not at all" to 5 = "maximal or unable to do because of breathlessness")  Scoring Scores range from 0 to 120.  Minimally important difference is 5 units  CAT: CAT can identify the health impairment of COPD patients and is better correlated with disease progression.  CAT has a scoring range of zero to 40. The CAT score is classified into four groups of low (less than 10), medium (10 - 20), high (21-30) and very high (31-40) based on the impact level of disease on health status. A CAT score over 10 suggests significant symptoms.  A worsening CAT score could be explained by an exacerbation, poor medication adherence, poor inhaler technique, or progression of COPD or comorbid conditions.  CAT MCID is 2 points  mMRC: mMRC (Modified Medical Research Council) Dyspnea Scale is used to assess the degree of baseline functional disability in patients of respiratory disease due to dyspnea. No minimal important difference is established. A decrease in score of 1 point or greater is considered a positive change.   Pulmonary Function Assessment:  Exercise Target Goals: Exercise Program Goal: Individual exercise prescription set using results from initial 6 min walk test and THRR while considering  patient's activity barriers and safety.   Exercise Prescription Goal: Initial exercise prescription builds to 30-45 minutes a day of aerobic activity, 2-3 days per week.  Home exercise guidelines will be given to patient during program as part of exercise prescription that the participant will acknowledge.  Activity Barriers & Risk Stratification:  Activity Barriers & Cardiac Risk Stratification - 08/02/20 1413      Activity Barriers & Cardiac Risk Stratification   Activity Barriers Shortness of Breath;Back Problems;Balance Concerns;Deconditioning     Cardiac Risk Stratification Moderate           6 Minute Walk:  6 Minute Walk    Row Name 08/02/20 1632         6 Minute Walk   Phase Initial     Distance 838 feet     Walk Time 6 minutes     # of Rest Breaks 0     MPH 1.59     METS 2.35     RPE 13     Perceived Dyspnea  3     VO2 Peak 8.24     Symptoms No     Resting HR 78 bpm     Resting BP 118/80     Resting Oxygen Saturation  98 %     Exercise Oxygen Saturation  during 6 min walk 87 %     Max Ex. HR 114 bpm     Max Ex. BP 132/80     2 Minute Post BP 110/70       Interval HR   1 Minute HR 82     2 Minute HR 95     3 Minute HR 94     4 Minute HR 92     5 Minute HR 108     6 Minute HR 114     2 Minute Post HR 75     Interval Heart Rate? Yes       Interval Oxygen   Interval Oxygen? Yes     Baseline Oxygen Saturation % 98 %     1 Minute Oxygen Saturation % 96 %     1 Minute Liters of Oxygen 3 L     2 Minute Oxygen Saturation % 90 %     2 Minute Liters of Oxygen 3 L     3 Minute Oxygen Saturation % 89 %     3 Minute Liters of Oxygen 3 L     4 Minute Oxygen Saturation % 87 %     4 Minute Liters of Oxygen 4 L     5 Minute Oxygen Saturation % 93 %     5 Minute Liters of Oxygen 4 L     6 Minute Oxygen Saturation % 92 %     6 Minute Liters of Oxygen 4 L     2 Minute Post Oxygen Saturation % 100 %     2 Minute Post Liters of Oxygen 3 L            Oxygen Initial Assessment:  Oxygen Initial Assessment - 08/02/20 1636      Initial 6 min Walk   Oxygen Used Continuous    Liters per minute 3      Program Oxygen Prescription   Program Oxygen Prescription Continuous      Intervention   Short Term Goals To learn  and exhibit compliance with exercise, home and travel O2 prescription;To learn and understand importance of monitoring SPO2 with pulse oximeter and demonstrate accurate use of the pulse oximeter.;To learn and understand importance of maintaining oxygen saturations>88%;To learn and demonstrate proper  pursed lip breathing techniques or other breathing techniques.;To learn and demonstrate proper use of respiratory medications    Long  Term Goals Exhibits compliance with exercise, home and travel O2 prescription;Verbalizes importance of monitoring SPO2 with pulse oximeter and return demonstration;Maintenance of O2 saturations>88%;Exhibits proper breathing techniques, such as pursed lip breathing or other method taught during program session;Compliance with respiratory medication;Demonstrates proper use of MDI's           Oxygen Re-Evaluation:  Oxygen Re-Evaluation    Row Name 08/02/20 1636 08/19/20 0821           Program Oxygen Prescription   Program Oxygen Prescription -- Continuous      Liters per minute 3 3        Home Oxygen   Home Oxygen Device -- Home Concentrator;E-Tanks      Sleep Oxygen Prescription -- Continuous      Liters per minute -- 2      Home Exercise Oxygen Prescription -- Continuous      Liters per minute -- 3      Home Resting Oxygen Prescription -- Continuous      Liters per minute -- 2      Compliance with Home Oxygen Use -- Yes        Goals/Expected Outcomes   Short Term Goals -- To learn and exhibit compliance with exercise, home and travel O2 prescription;To learn and understand importance of monitoring SPO2 with pulse oximeter and demonstrate accurate use of the pulse oximeter.;To learn and understand importance of maintaining oxygen saturations>88%;To learn and demonstrate proper pursed lip breathing techniques or other breathing techniques.;To learn and demonstrate proper use of respiratory medications      Long  Term Goals -- Exhibits compliance with exercise, home and travel O2 prescription;Verbalizes importance of monitoring SPO2 with pulse oximeter and return demonstration;Maintenance of O2 saturations>88%;Exhibits proper breathing techniques, such as pursed lip breathing or other method taught during program session;Compliance with respiratory  medication;Demonstrates proper use of MDI's      Goals/Expected Outcomes -- compliance and understanding of oxygen saturation and pursed lip breathing             Oxygen Discharge (Final Oxygen Re-Evaluation):  Oxygen Re-Evaluation - 08/19/20 0821      Program Oxygen Prescription   Program Oxygen Prescription Continuous    Liters per minute 3      Home Oxygen   Home Oxygen Device Home Concentrator;E-Tanks    Sleep Oxygen Prescription Continuous    Liters per minute 2    Home Exercise Oxygen Prescription Continuous    Liters per minute 3    Home Resting Oxygen Prescription Continuous    Liters per minute 2    Compliance with Home Oxygen Use Yes      Goals/Expected Outcomes   Short Term Goals To learn and exhibit compliance with exercise, home and travel O2 prescription;To learn and understand importance of monitoring SPO2 with pulse oximeter and demonstrate accurate use of the pulse oximeter.;To learn and understand importance of maintaining oxygen saturations>88%;To learn and demonstrate proper pursed lip breathing techniques or other breathing techniques.;To learn and demonstrate proper use of respiratory medications    Long  Term Goals Exhibits compliance with exercise, home and travel O2 prescription;Verbalizes importance of monitoring SPO2  with pulse oximeter and return demonstration;Maintenance of O2 saturations>88%;Exhibits proper breathing techniques, such as pursed lip breathing or other method taught during program session;Compliance with respiratory medication;Demonstrates proper use of MDI's    Goals/Expected Outcomes compliance and understanding of oxygen saturation and pursed lip breathing           Initial Exercise Prescription:  Initial Exercise Prescription - 08/03/20 0700      Date of Initial Exercise RX and Referring Provider   Date 08/02/20    Referring Provider Dr. Chase Caller    Expected Discharge Date 10/07/20      Oxygen   Oxygen Continuous    Liters  3      Recumbant Bike   Level 1    Minutes 15      NuStep   Level 2    Minutes 15      Prescription Details   Frequency (times per week) 2    Duration Progress to 30 minutes of continuous aerobic without signs/symptoms of physical distress      Intensity   THRR 40-80% of Max Heartrate 58-117    Ratings of Perceived Exertion 11-13    Perceived Dyspnea 0-4      Progression   Progression Continue to progress workloads to maintain intensity without signs/symptoms of physical distress.      Resistance Training   Training Prescription Yes    Weight orange bands    Reps 10-15           Perform Capillary Blood Glucose checks as needed.  Exercise Prescription Changes:  Exercise Prescription Changes    Row Name 08/19/20 0800             Response to Exercise   Blood Pressure (Admit) 112/58       Blood Pressure (Exercise) 100/64       Blood Pressure (Exit) 102/76       Heart Rate (Admit) 84 bpm       Heart Rate (Exercise) 118 bpm       Heart Rate (Exit) 85 bpm       Oxygen Saturation (Admit) 95 %       Oxygen Saturation (Exercise) 92 %       Oxygen Saturation (Exit) 98 %       Rating of Perceived Exertion (Exercise) 13       Perceived Dyspnea (Exercise) 1       Duration Continue with 30 min of aerobic exercise without signs/symptoms of physical distress.       Intensity Other (comment)  40%-80% HR Max         Progression   Progression Continue to progress workloads to maintain intensity without signs/symptoms of physical distress.       Average METs 2         Resistance Training   Training Prescription Yes       Weight blue bands       Reps 10-15       Time 10 Minutes         Oxygen   Oxygen Continuous       Liters 3         NuStep   Level 2       SPM 80       Minutes 15       METs 1.7         Track   Laps 11       Minutes 15       METs 2.28  Exercise Comments:  Exercise Comments    Row Name 08/10/20 1538           Exercise  Comments Pt completed first day of exercise and tolerated well with no complaints. Pt does have balance issues especially when walking. Will continue to monitor and progress as he is able.              Exercise Goals and Review:  Exercise Goals    Row Name 08/02/20 1415             Exercise Goals   Increase Physical Activity Yes       Intervention Provide advice, education, support and counseling about physical activity/exercise needs.;Develop an individualized exercise prescription for aerobic and resistive training based on initial evaluation findings, risk stratification, comorbidities and participant's personal goals.       Expected Outcomes Short Term: Attend rehab on a regular basis to increase amount of physical activity.;Long Term: Exercising regularly at least 3-5 days a week.;Long Term: Add in home exercise to make exercise part of routine and to increase amount of physical activity.       Increase Strength and Stamina Yes       Intervention Provide advice, education, support and counseling about physical activity/exercise needs.;Develop an individualized exercise prescription for aerobic and resistive training based on initial evaluation findings, risk stratification, comorbidities and participant's personal goals.       Expected Outcomes Short Term: Increase workloads from initial exercise prescription for resistance, speed, and METs.;Short Term: Perform resistance training exercises routinely during rehab and add in resistance training at home;Long Term: Improve cardiorespiratory fitness, muscular endurance and strength as measured by increased METs and functional capacity (6MWT)       Able to understand and use rate of perceived exertion (RPE) scale Yes       Intervention Provide education and explanation on how to use RPE scale       Expected Outcomes Short Term: Able to use RPE daily in rehab to express subjective intensity level;Long Term:  Able to use RPE to guide intensity  level when exercising independently       Able to understand and use Dyspnea scale Yes       Intervention Provide education and explanation on how to use Dyspnea scale       Expected Outcomes Short Term: Able to use Dyspnea scale daily in rehab to express subjective sense of shortness of breath during exertion;Long Term: Able to use Dyspnea scale to guide intensity level when exercising independently       Knowledge and understanding of Target Heart Rate Range (THRR) Yes       Intervention Provide education and explanation of THRR including how the numbers were predicted and where they are located for reference       Expected Outcomes Short Term: Able to state/look up THRR;Long Term: Able to use THRR to govern intensity when exercising independently;Short Term: Able to use daily as guideline for intensity in rehab       Understanding of Exercise Prescription Yes       Intervention Provide education, explanation, and written materials on patient's individual exercise prescription       Expected Outcomes Short Term: Able to explain program exercise prescription;Long Term: Able to explain home exercise prescription to exercise independently              Exercise Goals Re-Evaluation :  Exercise Goals Re-Evaluation    Row Name 08/19/20 [phone]  Exercise Goal Re-Evaluation   Exercise Goals Review Increase Physical Activity;Increase Strength and Stamina;Able to understand and use rate of perceived exertion (RPE) scale;Able to understand and use Dyspnea scale;Knowledge and understanding of Target Heart Rate Range (THRR);Understanding of Exercise Prescription       Comments Pt has completed 3 exercise sessions and has tolerated well so far. Pt does have balance issues. We have him use assistive device while walking to be safe, but he does not use one at home. He is exercising at 1.7 METS on the Nustep and 2.28 METS on the track. Will continue to monitor and progress as able       Expected  Outcomes Through exercise at rehab and home the patient will decrease shortness of breath with daily activities and feel confident in carrying out an exercise regimn at home.              Discharge Exercise Prescription (Final Exercise Prescription Changes):  Exercise Prescription Changes - 08/19/20 0800      Response to Exercise   Blood Pressure (Admit) 112/58    Blood Pressure (Exercise) 100/64    Blood Pressure (Exit) 102/76    Heart Rate (Admit) 84 bpm    Heart Rate (Exercise) 118 bpm    Heart Rate (Exit) 85 bpm    Oxygen Saturation (Admit) 95 %    Oxygen Saturation (Exercise) 92 %    Oxygen Saturation (Exit) 98 %    Rating of Perceived Exertion (Exercise) 13    Perceived Dyspnea (Exercise) 1    Duration Continue with 30 min of aerobic exercise without signs/symptoms of physical distress.    Intensity Other (comment)   40%-80% HR Max     Progression   Progression Continue to progress workloads to maintain intensity without signs/symptoms of physical distress.    Average METs 2      Resistance Training   Training Prescription Yes    Weight blue bands    Reps 10-15    Time 10 Minutes      Oxygen   Oxygen Continuous    Liters 3      NuStep   Level 2    SPM 80    Minutes 15    METs 1.7      Track   Laps 11    Minutes 15    METs 2.28           Nutrition:  Target Goals: Understanding of nutrition guidelines, daily intake of sodium <1588m, cholesterol <201m calories 30% from fat and 7% or less from saturated fats, daily to have 5 or more servings of fruits and vegetables.  Biometrics:  Pre Biometrics - 08/02/20 1630      Pre Biometrics   Grip Strength 33 kg            Nutrition Therapy Plan and Nutrition Goals:  Nutrition Therapy & Goals - 08/19/20 1433      Nutrition Therapy   Diet Generally healthful      Personal Nutrition Goals   Nutrition Goal Pt to build a healthy plate including vegetables, fruits, whole grains, and low-fat dairy  products in a heart healthy meal plan.    Personal Goal #2 Pt to maintain his weight      Intervention Plan   Intervention Prescribe, educate and counsel regarding individualized specific dietary modifications aiming towards targeted core components such as weight, hypertension, lipid management, diabetes, heart failure and other comorbidities.    Expected Outcomes Short Term Goal: Understand basic  principles of dietary content, such as calories, fat, sodium, cholesterol and nutrients.           Nutrition Assessments:  Nutrition Assessments - 08/19/20 1435      Rate Your Plate Scores   Pre Score 60           Nutrition Goals Re-Evaluation:  Nutrition Goals Re-Evaluation    [place] Name 08/19/20 1434             Goals   Current Weight 165 lb (74.8 kg)       Nutrition Goal Pt to build a healthy plate including vegetables, fruits, whole grains, and low-fat dairy products in a heart healthy meal plan.         Personal Goal #2 Re-Evaluation   Personal Goal #2 Pt to maintain his weight              Nutrition Goals Discharge (Final Nutrition Goals Re-Evaluation):  Nutrition Goals Re-Evaluation - 08/19/20 1434      Goals   Current Weight 165 lb (74.8 kg)    Nutrition Goal Pt to build a healthy plate including vegetables, fruits, whole grains, and low-fat dairy products in a heart healthy meal plan.      Personal Goal #2 Re-Evaluation   Personal Goal #2 Pt to maintain his weight           Psychosocial: Target Goals: Acknowledge presence or absence of significant depression and/or stress, maximize coping skills, provide positive support system. Participant is able to verbalize types and ability to use techniques and skills needed for reducing stress and depression.  Initial Review & Psychosocial Screening:  Initial Psych Review & Screening - 08/02/20 1420      Initial Review   Current issues with None Identified      Family Dynamics   Good Support System? Yes       Barriers   Psychosocial barriers to participate in program There are no identifiable barriers or psychosocial needs.;The patient should benefit from training in stress management and relaxation.      Screening Interventions   Interventions Encouraged to exercise           Quality of Life Scores:  Scores of 19 and below usually indicate a poorer quality of life in these areas.  A difference of  2-3 points is a clinically meaningful difference.  A difference of 2-3 points in the total score of the Quality of Life Index has been associated with significant improvement in overall quality of life, self-image, physical symptoms, and general health in studies assessing change in quality of life.  PHQ-9: Recent Review Flowsheet Data    Depression screen [facility] 2/9 08/02/2020 08/02/2020   Decreased Interest 0 0   Down, Depressed, Hopeless 0 0   PHQ - 2 Score 0 0   Altered sleeping 1 -   Tired, decreased energy 0 -   Change in appetite 0 -   Feeling bad or failure about yourself  0 -   Trouble concentrating 1 -   Moving slowly or fidgety/restless 2 -   Suicidal thoughts 0 -   PHQ-9 Score 4 -   Difficult doing work/chores Not difficult at all -     Interpretation of Total Score  Total Score Depression Severity:  1-4 = Minimal depression, 5-9 = Mild depression, 10-14 = Moderate depression, 15-19 = Moderately severe depression, 20-27 = Severe depression   Psychosocial Evaluation and Intervention:  Psychosocial Evaluation - 08/18/20 1158  Psychosocial Evaluation & Interventions   Interventions Encouraged to exercise with the program and follow exercise prescription    Comments Karl has a  supportive wife/caregiver strong faith community. Pt has memory issues and relies upon his wife for assistance.  This assistance that is needed  at times can be a source of discord between the two.    Expected Outcomes Jakylan will be accepting of the needed assistance from his wife.            Psychosocial Re-Evaluation:  Psychosocial Re-Evaluation    [place] Name 08/18/20 1203             Psychosocial Re-Evaluation   Current issues with None Identified       Comments Pt has memory issues and distracts easily       Expected Outcomes Kiernan will be accepting of the needed assistance from his wife       Continue Psychosocial Services  Follow up required by staff              Psychosocial Discharge (Final Psychosocial Re-Evaluation):  Psychosocial Re-Evaluation - 08/18/20 1203      Psychosocial Re-Evaluation   Current issues with None Identified    Comments Pt has memory issues and distracts easily    Expected Outcomes Dakari will be accepting of the needed assistance from his wife    Continue Psychosocial Services  Follow up required by staff           Education: Education Goals: Education classes will be provided on a weekly basis, covering required topics. Participant will state understanding/return demonstration of topics presented.  Learning Barriers/Preferences:  Learning Barriers/Preferences - 08/02/20 1421      Learning Barriers/Preferences   Learning Barriers Hearing;Sight;Inability to learn new things   memory issues   Learning Preferences Verbal Instruction;Written Material;Individual Instruction           Education Topics: Risk Factor Reduction:  -Group instruction that is supported by a PowerPoint presentation. Instructor discusses the definition of a risk factor, different risk factors for pulmonary disease, and how the heart and lungs work together.     PULMONARY REHAB OTHER RESPIRATORY from 08/19/2020 in [place]  Date 08/19/20  Educator Handout  Instruction Review Code 1- Verbalizes Understanding      Nutrition for Pulmonary Patient:  -Group instruction provided by PowerPoint slides, verbal discussion, and written materials to support subject matter. The instructor gives an explanation and review of  healthy diet recommendations, which includes a discussion on weight management, recommendations for fruit and vegetable consumption, as well as protein, fluid, caffeine, fiber, sodium, sugar, and alcohol. Tips for eating when patients are short of breath are discussed.   Pursed Lip Breathing:  -Group instruction that is supported by demonstration and informational handouts. Instructor discusses the benefits of pursed lip and diaphragmatic breathing and detailed demonstration on how to preform both.     Oxygen Safety:  -Group instruction provided by PowerPoint, verbal discussion, and written material to support subject matter. There is an overview of "What is Oxygen" and "Why do we need it".  Instructor also reviews how to create a safe environment for oxygen use, the importance of using oxygen as prescribed, and the risks of noncompliance. There is a brief discussion on traveling with oxygen and resources the patient may utilize.   Oxygen Equipment:  -Group instruction provided by [facility] Staff utilizing handouts, written materials, and equipment demonstrations.   Signs and Symptoms:  -Group instruction provided  by written material and verbal discussion to support subject matter. Warning signs and symptoms of infection, stroke, and heart attack are reviewed and when to call the physician/911 reinforced. Tips for preventing the spread of infection discussed.   Advanced Directives:  -Group instruction provided by verbal instruction and written material to support subject matter. Instructor reviews Advanced Directive laws and proper instruction for filling out document.   Pulmonary Video:  -Group video education that reviews the importance of medication and oxygen compliance, exercise, good nutrition, pulmonary hygiene, and pursed lip and diaphragmatic breathing for the pulmonary patient.   Exercise for the Pulmonary Patient:  -Group instruction that is supported by a PowerPoint  presentation. Instructor discusses benefits of exercise, core components of exercise, frequency, duration, and intensity of an exercise routine, importance of utilizing pulse oximetry during exercise, safety while exercising, and options of places to exercise outside of rehab.     Pulmonary Medications:  -Verbally interactive group education provided by instructor with focus on inhaled medications and proper administration.   Anatomy and Physiology of the Respiratory System and Intimacy:  -Group instruction provided by PowerPoint, verbal discussion, and written material to support subject matter. Instructor reviews respiratory cycle and anatomical components of the respiratory system and their functions. Instructor also reviews differences in obstructive and restrictive respiratory diseases with examples of each. Intimacy, Sex, and Sexuality differences are reviewed with a discussion on how relationships can change when diagnosed with pulmonary disease. Common sexual concerns are reviewed.   MD DAY -A group question and answer session with a medical doctor that allows participants to ask questions that relate to their pulmonary disease state.   OTHER EDUCATION -Group or individual verbal, written, or video instructions that support the educational goals of the pulmonary rehab program.   Holiday Eating Survival Tips:  -Group instruction provided by PowerPoint slides, verbal discussion, and written materials to support subject matter. The instructor gives patients tips, tricks, and techniques to help them not only survive but enjoy the holidays despite the onslaught of food that accompanies the holidays.   Knowledge Questionnaire Score:  Knowledge Questionnaire Score - 08/02/20 1638      Knowledge Questionnaire Score   Pre Score 14/18           Core Components/Risk Factors/Patient Goals at Admission:  Personal Goals and Risk Factors at Admission - 08/02/20 1424      Core  Components/Risk Factors/Patient Goals on Admission    Weight Management Weight Maintenance;Yes    Improve shortness of breath with ADL's Yes    Intervention Provide education, individualized exercise plan and daily activity instruction to help decrease symptoms of SOB with activities of daily living.    Expected Outcomes Short Term: Improve cardiorespiratory fitness to achieve a reduction of symptoms when performing ADLs;Long Term: Be able to perform more ADLs without symptoms or delay the onset of symptoms    Lipids Yes    Intervention Provide education and support for participant on nutrition & aerobic/resistive exercise along with prescribed medications to achieve LDL <56m, HDL >45m   statin intolerance   Expected Outcomes Short Term: Participant states understanding of desired cholesterol values and is compliant with medications prescribed. Participant is following exercise prescription and nutrition guidelines.;Long Term: Cholesterol controlled with medications as prescribed, with individualized exercise RX and with personalized nutrition plan. Value goals: LDL < 705mHDL > 40 mg.           Core Components/Risk Factors/Patient Goals Review:   Goals and Risk Factor Review  [place] Name 08/18/20 1204             Core Components/Risk Factors/Patient Goals Review   Personal Goals Review Weight Management/Obesity;Develop more efficient breathing techniques such as purse lipped breathing and diaphragmatic breathing and practicing self-pacing with activity.;Increase knowledge of respiratory medications and ability to use respiratory devices properly.;Improve shortness of breath with ADL's       Review Tyquon has completed 3 exercise sessions.  Pt is maintaining his weight between 75-76 kg.  Ladarious is learing the breathing techniques and demonstrates with verbal cues and reminders - due to memory issues and distractability.  Anticipate pt will continue to show progress with exercise pt averages 10  laps on the track and 2.0 METS on the nustep with level 3.       Expected Outcomes See Admission Goals              Core Components/Risk Factors/Patient Goals at Discharge (Final Review):   Goals and Risk Factor Review - 08/18/20 1204      Core Components/Risk Factors/Patient Goals Review   Personal Goals Review Weight Management/Obesity;Develop more efficient breathing techniques such as purse lipped breathing and diaphragmatic breathing and practicing self-pacing with activity.;Increase knowledge of respiratory medications and ability to use respiratory devices properly.;Improve shortness of breath with ADL's    Review Kasen has completed 3 exercise sessions.  Pt is maintaining his weight between 75-76 kg.  Bruno is learing the breathing techniques and demonstrates with verbal cues and reminders - due to memory issues and distractability.  Anticipate pt will continue to show progress with exercise pt averages 10 laps on the track and 2.0 METS on the nustep with level 3.    Expected Outcomes See Admission Goals           ITP Comments:   Comments:  Jahlon has completed 4 exercise session in Pulmonary rehab. Pt maintains good attendance and consistent home exercise. Pulmonary rehab staff will  continue to monitor and reassess progress toward goals during her participation in Pulmonary Rehab. Cherre Huger, BSN Cardiac and Training and development officer

## 2020-08-24 ENCOUNTER — Other Ambulatory Visit: Payer: Self-pay

## 2020-08-24 ENCOUNTER — Encounter (HOSPITAL_COMMUNITY)
Admission: RE | Admit: 2020-08-24 | Discharge: 2020-08-24 | Disposition: A | Discharge status: Home / Self Care | Payer: Medicare Other | Source: Ambulatory Visit | Attending: Internal Medicine | Admitting: Internal Medicine

## 2020-08-24 ENCOUNTER — Telehealth: Payer: Self-pay

## 2020-08-24 VITALS — Wt 166.0 lb

## 2020-08-24 DIAGNOSIS — J84112 Idiopathic pulmonary fibrosis: Secondary | ICD-10-CM | POA: Diagnosis present

## 2020-08-24 NOTE — Telephone Encounter (Signed)
Ok thanks. willl close message

## 2020-08-24 NOTE — Telephone Encounter (Signed)
Pt is doing [facility] talked to wife he is on 2nd week of rehab since starting back. Nothing further needed

## 2020-08-24 NOTE — Progress Notes (Signed)
Daily Session Note  Patient Details  Name: Roger Sims MRN: [id number] Date of Birth: 1946/02/17 Referring Provider:     Pulmonary Rehab Walk Test from 08/02/2020 in [place]  Referring Provider Dr. Chase Caller      Encounter Date: 08/24/2020  Check In:  Session Check In - 08/24/20 [place]      Check-In   Supervising physician immediately available to respond to emergencies [facility] immediately available    Physician(s) Dr. Darrick Meigs    Location [facility]    Staff Present Rosebud Poles, RN, BSN;Lisa Ysidro Evert, RN;Kelin Nixon Hassell Done, MS, ACSM-CEP, Exercise Physiologist    Virtual Visit No    Medication changes reported     No    Fall or balance concerns reported    No    Tobacco Cessation No Change    Warm-up and Cool-down Performed as group-led instruction    Resistance Training Performed Yes    VAD Patient? No    PAD/SET Patient? No      Pain Assessment   Currently in Pain? No/denies    Multiple Pain Sites No           Capillary Blood Glucose: No results found for this or any previous visit (from the past 24 hour(s)).   Exercise Prescription Changes - 08/24/20 1400      Response to Exercise   Blood Pressure (Admit) 120/64    Blood Pressure (Exercise) 120/64    Blood Pressure (Exit) 102/60    Heart Rate (Admit) 70 bpm    Heart Rate (Exercise) 94 bpm    Heart Rate (Exit) 80 bpm    Oxygen Saturation (Admit) 100 %    Oxygen Saturation (Exercise) 98 %    Oxygen Saturation (Exit) 97 %    Rating of Perceived Exertion (Exercise) 13    Perceived Dyspnea (Exercise) 2    Duration Continue with 30 min of aerobic exercise without signs/symptoms of physical distress.    Intensity THRR unchanged      Progression   Progression Continue to progress workloads to maintain intensity without signs/symptoms of physical distress.      Resistance Training   Training Prescription Yes    Weight blue bands    Reps 10-15    Time 10  Minutes      Oxygen   Oxygen Continuous    Liters 3      NuStep   Level 3    SPM 80    Minutes 15    METs 1.8      Track   Laps 12    Minutes 15    METs 2.39           Social History   Tobacco Use  Smoking Status Former Smoker  . Packs/day: 1.50  . Types: Cigarettes  Smokeless Tobacco Former Systems developer  Tobacco Comment   30 years ago    Goals Met:  Proper associated with RPD/PD & O2 Sat Exercise tolerated well No report of cardiac concerns or symptoms Strength training completed today  Goals Unmet:  Not Applicable  Comments: Service time is from 1323 to 1432    Dr. Fransico Him is Medical Director for Cardiac Rehab at [facility].

## 2020-08-26 ENCOUNTER — Encounter (HOSPITAL_COMMUNITY)
Admission: RE | Admit: 2020-08-26 | Discharge: 2020-08-26 | Disposition: A | Discharge status: Home / Self Care | Payer: Medicare Other | Source: Ambulatory Visit | Attending: Internal Medicine | Admitting: Internal Medicine

## 2020-08-26 ENCOUNTER — Other Ambulatory Visit: Payer: Self-pay

## 2020-08-26 DIAGNOSIS — J84112 Idiopathic pulmonary fibrosis: Secondary | ICD-10-CM | POA: Diagnosis not present

## 2020-08-26 NOTE — Progress Notes (Signed)
Daily Session Note  Patient Details  Name: Roger Sims MRN: [id number] Date of Birth: 1946-04-05 Referring Provider:     Pulmonary Rehab Walk Test from 08/02/2020 in [place]  Referring Provider Dr. Chase Caller      Encounter Date: 08/26/2020  Check In:  Session Check In - 08/26/20 1336      Check-In   Supervising physician immediately available to respond to emergencies [facility] immediately available    Physician(s) Dr. Verlon Au    [facility]    Staff Present Rosebud Poles, RN, Isaac Laud, MS, ACSM-CEP, Exercise Physiologist;Lisa Ysidro Evert, RN    Virtual Visit No    Medication changes reported     No    Fall or balance concerns reported    No    Tobacco Cessation No Change    Warm-up and Cool-down Performed as group-led instruction    Resistance Training Performed Yes    VAD Patient? No    PAD/SET Patient? No      Pain Assessment   Currently in Pain? No/denies    Multiple Pain Sites No           Capillary Blood Glucose: No results found for this or any previous visit (from the past 24 hour(s)).    Social History   Tobacco Use  Smoking Status Former Smoker  . Packs/day: 1.50  . Types: Cigarettes  Smokeless Tobacco Former Systems developer  Tobacco Comment   30 years ago    Goals Met:  Proper associated with RPD/PD & O2 Sat Exercise tolerated well No report of cardiac concerns or symptoms Strength training completed today  Goals Unmet:  Not Applicable  Comments: Service time is from 1325 to 1445    Dr. Fransico Him is Medical Director for Cardiac Rehab at [facility].

## 2020-08-31 ENCOUNTER — Encounter (HOSPITAL_COMMUNITY)
Admission: RE | Admit: 2020-08-31 | Discharge: 2020-08-31 | Disposition: A | Discharge status: Home / Self Care | Payer: Medicare Other | Source: Ambulatory Visit | Attending: Internal Medicine | Admitting: Internal Medicine

## 2020-08-31 ENCOUNTER — Other Ambulatory Visit: Payer: Self-pay

## 2020-08-31 DIAGNOSIS — J84112 Idiopathic pulmonary fibrosis: Secondary | ICD-10-CM

## 2020-08-31 NOTE — Progress Notes (Signed)
Daily Session Note  Patient Details  Name: Roger Sims MRN: [id number] Date of Birth: Feb 25, 1946 Referring Provider:     Pulmonary Rehab Walk Test from 08/02/2020 in [place]  Referring Provider Dr. Chase Caller      Encounter Date: 08/31/2020  Check In:  Session Check In - 08/31/20 1433      Check-In   Supervising physician immediately available to respond to emergencies [facility] immediately available    Physician(s) Dr. Verlon Au    [facility]    Staff Present Rosebud Poles, RN, Isaac Laud, MS, ACSM-CEP, Exercise Physiologist;Lisa Ysidro Evert, RN    Virtual Visit No    Medication changes reported     No    Fall or balance concerns reported    No    Tobacco Cessation No Change    Warm-up and Cool-down Performed as group-led instruction    Resistance Training Performed Yes    VAD Patient? No    PAD/SET Patient? No      Pain Assessment   Currently in Pain? No/denies    Multiple Pain Sites No           Capillary Blood Glucose: No results found for this or any previous visit (from the past 24 hour(s)).    Social History   Tobacco Use  Smoking Status Former Smoker  . Packs/day: 1.50  . Types: Cigarettes  Smokeless Tobacco Former Systems developer  Tobacco Comment   30 years ago    Goals Met:  Proper associated with RPD/PD & O2 Sat Exercise tolerated well No report of cardiac concerns or symptoms Strength training completed today  Goals Unmet:  Not Applicable  Comments: Service time is from 1330 to 1447    Dr. Fransico Him is Medical Director for Cardiac Rehab at [facility].

## 2020-09-02 ENCOUNTER — Other Ambulatory Visit: Payer: Self-pay

## 2020-09-02 ENCOUNTER — Encounter (HOSPITAL_COMMUNITY)
Admission: RE | Admit: 2020-09-02 | Discharge: 2020-09-02 | Disposition: A | Discharge status: Home / Self Care | Payer: Medicare Other | Source: Ambulatory Visit | Attending: Internal Medicine | Admitting: Internal Medicine

## 2020-09-02 ENCOUNTER — Ambulatory Visit (INDEPENDENT_AMBULATORY_CARE_PROVIDER_SITE_OTHER): Payer: Medicare Other | Admitting: Internal Medicine

## 2020-09-02 DIAGNOSIS — J84112 Idiopathic pulmonary fibrosis: Secondary | ICD-10-CM | POA: Diagnosis not present

## 2020-09-02 DIAGNOSIS — J439 Emphysema, unspecified: Secondary | ICD-10-CM

## 2020-09-02 NOTE — Progress Notes (Signed)
Daily Session Note  Patient Details  Name: Roger Sims MRN: [id number] Date of Birth: Aug 26, 1946 Referring Provider:     Pulmonary Rehab Walk Test from 08/02/2020 in [place]  Referring Provider Dr. Chase Caller      Encounter Date: 09/02/2020  Check In:  Session Check In - 09/02/20 1425      Check-In   Supervising physician immediately available to respond to emergencies [facility] immediately available    Physician(s) Dr. Wynelle Cleveland    [facility]    Staff Present Rosebud Poles, RN, Isaac Laud, MS, ACSM-CEP, Exercise Physiologist;Lisa Ysidro Evert, RN    Virtual Visit No    Medication changes reported     No    Fall or balance concerns reported    No    Tobacco Cessation No Change    Warm-up and Cool-down Performed on first and last piece of equipment    Resistance Training Performed Yes    VAD Patient? No    PAD/SET Patient? No      Pain Assessment   Currently in Pain? No/denies    Pain Score 0-No pain    Multiple Pain Sites No           Capillary Blood Glucose: No results found for this or any previous visit (from the past 24 hour(s)).    Social History   Tobacco Use  Smoking Status Former Smoker  . Packs/day: 1.50  . Types: Cigarettes  Smokeless Tobacco Former Systems developer  Tobacco Comment   30 years ago    Goals Met:  Proper associated with RPD/PD & O2 Sat Exercise tolerated well Strength training completed today  Goals Unmet:  Not Applicable  Comments: Service time is from 1325 to 1440.    Dr. Fransico Him is Medical Director for Cardiac Rehab at [facility].

## 2020-09-02 NOTE — Progress Notes (Signed)
Spirometry and DLCO performed today.

## 2020-09-06 LAB — PULMONARY FUNCTION TEST
DL/VA % pred: 76 %
DL/VA: 3.01 ml/min/mmHg/L
DLCO cor % pred: 52 %
DLCO cor: 14.08 ml/min/mmHg
DLCO unc % pred: 52 %
DLCO unc: 14.08 ml/min/mmHg
FEF 25-75 Pre: 2.85 L/sec
FEF2575-%Pred-Pre: 115 %
FEV1-%Pred-Pre: 78 %
FEV1-Pre: 2.62 L
FEV1FVC-%Pred-Pre: 116 %
FEV6-%Pred-Pre: 69 %
FEV6-Pre: 3.02 L
FEV6FVC-%Pred-Pre: 106 %
FVC-%Pred-Pre: 66 %
FVC-Pre: 3.09 L
Pre FEV1/FVC ratio: 85 %
Pre FEV6/FVC Ratio: 100 %

## 2020-09-07 ENCOUNTER — Encounter (HOSPITAL_COMMUNITY)
Admission: RE | Admit: 2020-09-07 | Discharge: 2020-09-07 | Disposition: A | Discharge status: Home / Self Care | Payer: Medicare Other | Source: Ambulatory Visit | Attending: Internal Medicine | Admitting: Internal Medicine

## 2020-09-07 ENCOUNTER — Other Ambulatory Visit: Payer: Self-pay

## 2020-09-07 VITALS — Wt 166.9 lb

## 2020-09-07 DIAGNOSIS — J84112 Idiopathic pulmonary fibrosis: Secondary | ICD-10-CM

## 2020-09-07 NOTE — Progress Notes (Signed)
Daily Session Note  Patient Details  Name: TERREON EKHOLM MRN: [id number] Date of Birth: 10/08/1946 Referring Provider:     Pulmonary Rehab Walk Test from 08/02/2020 in [place]  Referring Provider Dr. Chase Caller      Encounter Date: 09/07/2020  Check In:  Session Check In - 09/07/20 1442      Check-In   Supervising physician immediately available to respond to emergencies [facility] immediately available    Physician(s) Dr. Ree Kida    Staff Present Rosebud Poles, RN, BSN;Mykel Mohl Ysidro Evert, RN;Jessica Hassell Done, MS, ACSM-CEP, Exercise Physiologist    Virtual Visit No    Medication changes reported     No    Fall or balance concerns reported    No    Tobacco Cessation No Change    Warm-up and Cool-down Performed on first and last piece of equipment    Resistance Training Performed Yes    VAD Patient? No    PAD/SET Patient? No      Pain Assessment   Currently in Pain? No/denies    Pain Score 0-No pain    Multiple Pain Sites No           Capillary Blood Glucose: No results found for this or any previous visit (from the past 24 hour(s)).   Exercise Prescription Changes - 09/07/20 1500      Response to Exercise   Blood Pressure (Admit) 108/60    Blood Pressure (Exercise) 120/70    Blood Pressure (Exit) 100/70    Heart Rate (Admit) 80 bpm    Heart Rate (Exercise) 98 bpm    Heart Rate (Exit) 87 bpm    Oxygen Saturation (Admit) 98 %    Oxygen Saturation (Exercise) 92 %    Oxygen Saturation (Exit) 96 %    Rating of Perceived Exertion (Exercise) 14    Perceived Dyspnea (Exercise) 3    Duration Continue with 45 min of aerobic exercise without signs/symptoms of physical distress.    Intensity THRR unchanged      Progression   Progression Continue to progress workloads to maintain intensity without signs/symptoms of physical distress.      Resistance Training   Training Prescription Yes    Weight blue bands    Reps 10-15    Time 10  Minutes      Oxygen   Oxygen Continuous    Liters 3      NuStep   Level 3    SPM 80    Minutes 15    METs 1.9      Track   Laps 10    Minutes 15           Social History   Tobacco Use  Smoking Status Former Smoker  . Packs/day: 1.50  . Types: Cigarettes  Smokeless Tobacco Former Systems developer  Tobacco Comment   30 years ago    Goals Met:  Exercise tolerated well No report of cardiac concerns or symptoms Strength training completed today  Goals Unmet:  Not Applicable  Comments: Service time is from 1325 to 1440    Dr. Fransico Him is Medical Director for Cardiac Rehab at [facility].

## 2020-09-09 ENCOUNTER — Other Ambulatory Visit: Payer: Self-pay

## 2020-09-09 ENCOUNTER — Encounter (HOSPITAL_COMMUNITY)
Admission: RE | Admit: 2020-09-09 | Discharge: 2020-09-09 | Disposition: A | Discharge status: Home / Self Care | Payer: Medicare Other | Source: Ambulatory Visit | Attending: Internal Medicine | Admitting: Internal Medicine

## 2020-09-09 DIAGNOSIS — J84112 Idiopathic pulmonary fibrosis: Secondary | ICD-10-CM | POA: Diagnosis not present

## 2020-09-09 NOTE — Progress Notes (Signed)
Daily Session Note  Patient Details  Name: Roger Sims MRN: [id number] Date of Birth: [birth year] Referring Provider:     Pulmonary Rehab Walk Test from 08/02/2020 in [place]  Referring Provider Dr. Chase Caller      Encounter Date: 09/09/2020  Check In:  Session Check In - 09/09/20 1344      Check-In   Supervising physician immediately available to respond to emergencies [facility] immediately available    Physician(s) Dr. Algis Liming    [facility]    Staff Present Rosebud Poles, RN, BSN;Lisa Ysidro Evert, RN;Clemente Dewey Hassell Done, MS, ACSM-CEP, Exercise Physiologist    Virtual Visit No    Medication changes reported     No    Fall or balance concerns reported    No    Tobacco Cessation No Change    Warm-up and Cool-down Performed as group-led instruction    Resistance Training Performed Yes    VAD Patient? No    PAD/SET Patient? No      Pain Assessment   Currently in Pain? No/denies    Multiple Pain Sites No           Capillary Blood Glucose: No results found for this or any previous visit (from the past 24 hour(s)).    Social History   Tobacco Use  Smoking Status Former Smoker  . Packs/day: 1.50  . Types: Cigarettes  Smokeless Tobacco Former Systems developer  Tobacco Comment   30 years ago    Goals Met:  Proper associated with RPD/PD & O2 Sat Exercise tolerated well Personal goals reviewed No report of cardiac concerns or symptoms Strength training completed today  Goals Unmet:  Not Applicable  Comments: Service time is from 1330 to 1445    Dr. Fransico Him is Medical Director for Cardiac Rehab at [facility].

## 2020-09-09 NOTE — Progress Notes (Signed)
I have reviewed a Home Exercise Prescription with Roger Sims . Roger Sims is rarely exercising at home. He states he may exercise 1 day a week for 5-10 minutes. The patient was advised to walk and use resistance bands 1-2 days a week for 15-30 minutes.  Roger Sims and I discussed how to progress their exercise prescription.  The patient stated that their goals were to live until he was 74 years old. The patient stated that they understand the exercise prescription.  We reviewed exercise guidelines, target heart rate during exercise, RPE Scale, weather conditions, Rescue Inhaler, endpoints for exercise, warmup and cool down.  Patient is encouraged to come to me with any questions. I will continue to follow up with the patient to assist them with progression and safety.    Roger Duff MS, ACSM CEP

## 2020-09-13 NOTE — Progress Notes (Signed)
Pulmonary Individual Treatment Plan  Patient Details  Name: Roger Sims MRN: [id number] Date of Birth: 08/14/1946 Referring Provider:     Pulmonary Rehab Walk Test from 08/02/2020 in [facility]  Referring Provider Dr. Chase Caller      Initial Encounter Date:    Pulmonary Rehab Walk Test from 08/02/2020 in [place] [facility]  Date 08/02/20      Visit Diagnosis: IPF (idiopathic pulmonary fibrosis) ([place])  Patient's Home Medications on Admission:   Current Outpatient Medications:  .  aspirin 81 MG EC tablet, Take by mouth. , Disp: , Rfl:  .  ESBRIET 267 MG TABS, Take by mouth. (Patient not taking: Reported on 08/05/2020), Disp: , Rfl:  .  latanoprost (XALATAN) 0.005 % ophthalmic solution, , Disp: , Rfl:  .  levofloxacin (LEVAQUIN) 500 MG tablet, Take 1 tablet (500 mg total) by mouth daily. Take 564m daily for 5 days, Disp: 5 tablet, Rfl: 0 .  nebivolol (BYSTOLIC) 10 MG tablet, Take 1 tablet by mouth daily., Disp: , Rfl:  .  predniSONE (STERAPRED UNI-PAK 21 TAB) 10 MG (21) TBPK tablet, Take by mouth daily. Take 436mfor 2 days, 3032mor 2 days then 68m38mr 2 days then 10mg60m 2 days the 5mg f26m2 days then stop, Disp: 22 tablet, Rfl: 0 .  Tiotropium Bromide Monohydrate (SPIRIVA RESPIMAT) 1.25 MCG/ACT AERS, Inhale 2 puffs into the lungs daily., Disp: 4 g, Rfl: 0  Past Medical History: No past medical history on file.  Tobacco Use: Social History   Tobacco Use  Smoking Status Former Smoker  . Packs/day: 1.50  . 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     Capillary Blood Glucose: No results found for: GLUCAP   Pulmonary Assessment Scores:  Pulmonary Assessment Scores    Row Name 08/02/20 1638         ADL UCSD   ADL Phase Entry     SOB Score total 52       CAT Score   CAT Score 20        mMRC Score   mMRC Score 3           UCSD: Self-administered rating of dyspnea associated with activities of daily living (ADLs) 6-point scale (0 = "not at all" to 5 = "maximal or unable to do because of breathlessness")  Scoring Scores range from 0 to 120.  Minimally important difference is 5 units  CAT: CAT can identify the health impairment of COPD patients and is better correlated with disease progression.  CAT has a scoring range of zero to 40. The CAT score is classified into four groups of low (less than 10), medium (10 - 20), high (21-30) and very high (31-40) based on the impact level of disease on health status. A CAT score over 10 suggests significant symptoms.  A worsening CAT score could be explained by an exacerbation, poor medication adherence, poor inhaler technique, or progression of COPD or comorbid conditions.  CAT MCID is 2 points  mMRC: mMRC (Modified Medical Research Council) Dyspnea Scale is used to assess the degree of baseline functional disability in patients of respiratory disease due to dyspnea. No minimal important difference is established. A decrease in score of 1 point or greater is considered a positive change.   Pulmonary Function Assessment:  Exercise Target Goals: Exercise Program Goal: Individual exercise prescription set using results from initial 6 min walk test and THRR while considering  patient's activity barriers and safety.   Exercise Prescription Goal: Initial exercise prescription builds to 30-45 minutes a day of aerobic activity, 2-3 days per week.  Home exercise guidelines will be given to patient during program as part of exercise prescription that the participant will acknowledge.  Activity Barriers & Risk Stratification:  Activity Barriers & Cardiac Risk Stratification - 08/02/20 1413      Activity Barriers & Cardiac Risk Stratification   Activity Barriers Shortness of Breath;Back Problems;Balance Concerns;Deconditioning     Cardiac Risk Stratification Moderate           6 Minute Walk:  6 Minute Walk    Row Name 08/02/20 1632         6 Minute Walk   Phase Initial     Distance 838 feet     Walk Time 6 minutes     # of Rest Breaks 0     MPH 1.59     METS 2.35     RPE 13     Perceived Dyspnea  3     VO2 Peak 8.24     Symptoms No     Resting HR 78 bpm     Resting BP 118/80     Resting Oxygen Saturation  98 %     Exercise Oxygen Saturation  during 6 min walk 87 %     Max Ex. HR 114 bpm     Max Ex. BP 132/80     2 Minute Post BP 110/70       Interval HR   1 Minute HR 82     2 Minute HR 95     3 Minute HR 94     4 Minute HR 92     5 Minute HR 108     6 Minute HR 114     2 Minute Post HR 75     Interval Heart Rate? Yes       Interval Oxygen   Interval Oxygen? Yes     Baseline Oxygen Saturation % 98 %     1 Minute Oxygen Saturation % 96 %     1 Minute Liters of Oxygen 3 L     2 Minute Oxygen Saturation % 90 %     2 Minute Liters of Oxygen 3 L     3 Minute Oxygen Saturation % 89 %     3 Minute Liters of Oxygen 3 L     4 Minute Oxygen Saturation % 87 %     4 Minute Liters of Oxygen 4 L     5 Minute Oxygen Saturation % 93 %     5 Minute Liters of Oxygen 4 L     6 Minute Oxygen Saturation % 92 %     6 Minute Liters of Oxygen 4 L     2 Minute Post Oxygen Saturation % 100 %     2 Minute Post Liters of Oxygen 3 L            Oxygen Initial Assessment:  Oxygen Initial Assessment - 08/02/20 1636      Initial 6 min Walk   Oxygen Used Continuous    Liters per minute 3      Program Oxygen Prescription   Program Oxygen Prescription Continuous      Intervention   Short Term Goals To learn  and exhibit compliance with exercise, home and travel O2 prescription;To learn and understand importance of monitoring SPO2 with pulse oximeter and demonstrate accurate use of the pulse oximeter.;To learn and understand importance of maintaining oxygen saturations>88%;To learn and demonstrate proper  pursed lip breathing techniques or other breathing techniques.;To learn and demonstrate proper use of respiratory medications    Long  Term Goals Exhibits compliance with exercise, home and travel O2 prescription;Verbalizes importance of monitoring SPO2 with pulse oximeter and return demonstration;Maintenance of O2 saturations>88%;Exhibits proper breathing techniques, such as pursed lip breathing or other method taught during program session;Compliance with respiratory medication;Demonstrates proper use of MDI's           Oxygen Re-Evaluation:  Oxygen Re-Evaluation    Row Name 08/02/20 1636 08/19/20 0821 09/09/20 0746         Program Oxygen Prescription   Program Oxygen Prescription -- Continuous Continuous     Liters per minute _0 Home Oxygen   Home Oxygen Device -- Home Concentrator;E-Tanks Home Concentrator;E-Tanks     Sleep Oxygen Prescription -- Continuous Continuous     Liters per minute -- 2 2     Home Exercise Oxygen Prescription -- Continuous Continuous     Liters per minute -- 3 3     Home Resting Oxygen Prescription -- Continuous Continuous     Liters per minute -- 2 2     Compliance with Home Oxygen Use -- Yes Yes       Goals/Expected Outcomes   Short Term Goals -- To learn and exhibit compliance with exercise, home and travel O2 prescription;To learn and understand importance of monitoring SPO2 with pulse oximeter and demonstrate accurate use of the pulse oximeter.;To learn and understand importance of maintaining oxygen saturations>88%;To learn and demonstrate proper pursed lip breathing techniques or other breathing techniques.;To learn and demonstrate proper use of respiratory medications To learn and exhibit compliance with exercise, home and travel O2 prescription;To learn and understand importance of monitoring SPO2 with pulse oximeter and demonstrate accurate use of the pulse oximeter.;To learn and understand importance of maintaining oxygen  saturations>88%;To learn and demonstrate proper pursed lip breathing techniques or other breathing techniques.;To learn and demonstrate proper use of respiratory medications     Long  Term Goals -- Exhibits compliance with exercise, home and travel O2 prescription;Verbalizes importance of monitoring SPO2 with pulse oximeter and return demonstration;Maintenance of O2 saturations>88%;Exhibits proper breathing techniques, such as pursed lip breathing or other method taught during program session;Compliance with respiratory medication;Demonstrates proper use of MDI's Exhibits compliance with exercise, home and travel O2 prescription;Verbalizes importance of monitoring SPO2 with pulse oximeter and return demonstration;Maintenance of O2 saturations>88%;Exhibits proper breathing techniques, such as pursed lip breathing or other method taught during program session;Compliance with respiratory medication;Demonstrates proper use of MDI's     Goals/Expected Outcomes -- compliance and understanding of oxygen saturation and pursed lip breathing compliance and understanding of oxygen saturation and pursed lip breathing            Oxygen Discharge (Final Oxygen Re-Evaluation):  Oxygen Re-Evaluation - 09/09/20 0746      Program Oxygen Prescription   Program Oxygen Prescription Continuous    Liters per minute 3      Home Oxygen   Home Oxygen Device Home Concentrator;E-Tanks    Sleep Oxygen Prescription Continuous    Liters per minute 2    Home Exercise Oxygen Prescription Continuous    Liters per minute 3    Home  Resting Oxygen Prescription Continuous    Liters per minute 2    Compliance with Home Oxygen Use Yes      Goals/Expected Outcomes   Short Term Goals To learn and exhibit compliance with exercise, home and travel O2 prescription;To learn and understand importance of monitoring SPO2 with pulse oximeter and demonstrate accurate use of the pulse oximeter.;To learn and understand importance of  maintaining oxygen saturations>88%;To learn and demonstrate proper pursed lip breathing techniques or other breathing techniques.;To learn and demonstrate proper use of respiratory medications    Long  Term Goals Exhibits compliance with exercise, home and travel O2 prescription;Verbalizes importance of monitoring SPO2 with pulse oximeter and return demonstration;Maintenance of O2 saturations>88%;Exhibits proper breathing techniques, such as pursed lip breathing or other method taught during program session;Compliance with respiratory medication;Demonstrates proper use of MDI's    Goals/Expected Outcomes compliance and understanding of oxygen saturation and pursed lip breathing           Initial Exercise Prescription:  Initial Exercise Prescription - 08/03/20 0700      Date of Initial Exercise RX and Referring Provider   Date 08/02/20    Referring Provider Dr. Chase Caller    Expected Discharge Date 10/07/20      Oxygen   Oxygen Continuous    Liters 3      Recumbant Bike   Level 1    Minutes 15      NuStep   Level 2    Minutes 15      Prescription Details   Frequency (times per week) 2    Duration Progress to 30 minutes of continuous aerobic without signs/symptoms of physical distress      Intensity   THRR 40-80% of Max Heartrate 58-117    Ratings of Perceived Exertion 11-13    Perceived Dyspnea 0-4      Progression   Progression Continue to progress workloads to maintain intensity without signs/symptoms of physical distress.      Resistance Training   Training Prescription Yes    Weight orange bands    Reps 10-15           Perform Capillary Blood Glucose checks as needed.  Exercise Prescription Changes:  Exercise Prescription Changes    Row Name 08/19/20 0800 08/24/20 1400 09/07/20 1500 09/09/20 1500       Response to Exercise   Blood Pressure (Admit) 112/58 120/64 108/60 --    Blood Pressure (Exercise) 100/64 120/64 120/70 --    Blood Pressure (Exit) 102/76  102/60 100/70 --    Heart Rate (Admit) 84 bpm 70 bpm 80 bpm --    Heart Rate (Exercise) 118 bpm 94 bpm 98 bpm --    Heart Rate (Exit) 85 bpm 80 bpm 87 bpm --    Oxygen Saturation (Admit) 95 % 100 % 98 % --    Oxygen Saturation (Exercise) 92 % 98 % 92 % --    Oxygen Saturation (Exit) 98 % 97 % 96 % --    Rating of Perceived Exertion (Exercise) _0 --    Perceived Dyspnea (Exercise) _1 --    Duration Continue with 30 min of aerobic exercise without signs/symptoms of physical distress. Continue with 30 min of aerobic exercise without signs/symptoms of physical distress. Continue with 45 min of aerobic exercise without signs/symptoms of physical distress. --    Intensity Other (comment)  40%-80% HR Max THRR unchanged THRR unchanged --      Progression   Progression Continue to  progress workloads to maintain intensity without signs/symptoms of physical distress. Continue to progress workloads to maintain intensity without signs/symptoms of physical distress. Continue to progress workloads to maintain intensity without signs/symptoms of physical distress. --    Average METs 2 -- -- --      Horticulturist, commercial Prescription Yes Yes Yes --    Weight blue bands blue bands blue bands --    Reps 10-15 10-15 10-15 --    Time 10 Minutes 10 Minutes 10 Minutes --      Oxygen   Oxygen Continuous Continuous Continuous --    Liters _0 --      NuStep   Level _1 --    SPM 80 80 80 --    Minutes _2 --    METs 1.7 1.8 1.9 --      Track   Laps _3 --    Minutes _4 --    METs 2.28 2.39 -- --      Home Exercise Plan   Plans to continue exercise at -- -- -- Home (comment)    Frequency -- -- -- Add 2 additional days to program exercise sessions.    Initial Home Exercises Provided -- -- -- 09/09/20           Exercise Comments:  Exercise Comments    Row Name 08/10/20 1538 09/09/20 1511         Exercise Comments Pt completed first day of exercise and  tolerated well with no complaints. Pt does have balance issues especially when walking. Will continue to monitor and progress as he is able. Completed home exercise with pt. Pt is very rarely doing exercise at home. Gave him suggesstions and ways to progress home exercise. Recommended that he exercise 2 extra days outside of rehab. Pt was receptive.             Exercise Goals and Review:  Exercise Goals    Row Name 08/02/20 1415             Exercise Goals   Increase Physical Activity Yes       Intervention Provide advice, education, support and counseling about physical activity/exercise needs.;Develop an individualized exercise prescription for aerobic and resistive training based on initial evaluation findings, risk stratification, comorbidities and participant's personal goals.       Expected Outcomes Short Term: Attend rehab on a regular basis to increase amount of physical activity.;Long Term: Exercising regularly at least 3-5 days a week.;Long Term: Add in home exercise to make exercise part of routine and to increase amount of physical activity.       Increase Strength and Stamina Yes       Intervention Provide advice, education, support and counseling about physical activity/exercise needs.;Develop an individualized exercise prescription for aerobic and resistive training based on initial evaluation findings, risk stratification, comorbidities and participant's personal goals.       Expected Outcomes Short Term: Increase workloads from initial exercise prescription for resistance, speed, and METs.;Short Term: Perform resistance training exercises routinely during rehab and add in resistance training at home;Long Term: Improve cardiorespiratory fitness, muscular endurance and strength as measured by increased METs and functional capacity (6MWT)       Able to understand and use rate of perceived exertion (RPE) scale Yes       Intervention Provide education and explanation on how to use  RPE scale       Expected  Outcomes Short Term: Able to use RPE daily in rehab to express subjective intensity level;Long Term:  Able to use RPE to guide intensity level when exercising independently       Able to understand and use Dyspnea scale Yes       Intervention Provide education and explanation on how to use Dyspnea scale       Expected Outcomes Short Term: Able to use Dyspnea scale daily in rehab to express subjective sense of shortness of breath during exertion;Long Term: Able to use Dyspnea scale to guide intensity level when exercising independently       Knowledge and understanding of Target Heart Rate Range (THRR) Yes       Intervention Provide education and explanation of THRR including how the numbers were predicted and where they are located for reference       Expected Outcomes Short Term: Able to state/look up THRR;Long Term: Able to use THRR to govern intensity when exercising independently;Short Term: Able to use daily as guideline for intensity in rehab       Understanding of Exercise Prescription Yes       Intervention Provide education, explanation, and written materials on patient's individual exercise prescription       Expected Outcomes Short Term: Able to explain program exercise prescription;Long Term: Able to explain home exercise prescription to exercise independently              Exercise Goals Re-Evaluation :  Exercise Goals Re-Evaluation    Row Name 08/19/20 0819 09/09/20 0742           Exercise Goal Re-Evaluation   Exercise Goals Review Increase Physical Activity;Increase Strength and Stamina;Able to understand and use rate of perceived exertion (RPE) scale;Able to understand and use Dyspnea scale;Knowledge and understanding of Target Heart Rate Range (THRR);Understanding of Exercise Prescription Increase Physical Activity;Increase Strength and Stamina;Able to understand and use rate of perceived exertion (RPE) scale;Able to understand and use Dyspnea  scale;Knowledge and understanding of Target Heart Rate Range (THRR);Understanding of Exercise Prescription      Comments Pt has completed 3 exercise sessions and has tolerated well so far. Pt does have balance issues. We have him use assistive device while walking to be safe, but he does not use one at home. He is exercising at 1.7 METS on the Nustep and 2.28 METS on the track. Will continue to monitor and progress as able Pt has completed 9 exercise sessions and has been making steady progressions on the Nustep. Pt also walks the track with a wheelchair device because he has poor balance. Overall, he is doing fairly well. He is exercising at 1.9 METS in the Nustep and 2.28 METS on the track. Will continue to monitor and progress as able.      Expected Outcomes Through exercise at rehab and home the patient will decrease shortness of breath with daily activities and feel confident in carrying out an exercise regimn at home. Through exercise at rehab and home the patient will decrease shortness of breath with daily activities and feel confident in carrying out an exercise regimn at home.             Discharge Exercise Prescription (Final Exercise Prescription Changes):  Exercise Prescription Changes - 09/09/20 1500      Home Exercise Plan   Plans to continue exercise at Home (comment)    Frequency Add 2 additional days to program exercise sessions.    Initial Home Exercises Provided 09/09/20  Nutrition:  Target Goals: Understanding of nutrition guidelines, daily intake of sodium <1531m, cholesterol <2020m calories 30% from fat and 7% or less from saturated fats, daily to have 5 or more servings of fruits and vegetables.  Biometrics:  Pre Biometrics - 08/02/20 1630      Pre Biometrics   Grip Strength 33 kg            Nutrition Therapy Plan and Nutrition Goals:  Nutrition Therapy & Goals - 08/19/20 1433      Nutrition Therapy   Diet Generally healthful      Personal  Nutrition Goals   Nutrition Goal Pt to build a healthy plate including vegetables, fruits, whole grains, and low-fat dairy products in a heart healthy meal plan.    Personal Goal #2 Pt to maintain his weight      Intervention Plan   Intervention Prescribe, educate and counsel regarding individualized specific dietary modifications aiming towards targeted core components such as weight, hypertension, lipid management, diabetes, heart failure and other comorbidities.    Expected Outcomes Short Term Goal: Understand basic principles of dietary content, such as calories, fat, sodium, cholesterol and nutrients.           Nutrition Assessments:  Nutrition Assessments - 08/19/20 1435      Rate Your Plate Scores   Pre Score 60          MEDIFICTS Score Key:  ?70 Need to make dietary changes   40-70 Heart Healthy Diet  ? 40 Therapeutic Level Cholesterol Diet   Picture Your Plate Scores:  <4<46nhealthy dietary pattern with much room for improvement.  41-50 Dietary pattern unlikely to meet recommendations for good health and room for improvement.  51-60 More healthful dietary pattern, with some room for improvement.   >60 Healthy dietary pattern, although there may be some specific behaviors that could be improved.    Nutrition Goals Re-Evaluation:  Nutrition Goals Re-Evaluation    [place] 08/19/20 1434 09/09/20 0747 09/09/20 0752         Goals   Current Weight 165 lb (74.8 kg) 166 lb 14.2 oz (75.7 kg) --     Nutrition Goal Pt to build a healthy plate including vegetables, fruits, whole grains, and low-fat dairy products in a heart healthy meal plan. Pt to build a healthy plate including vegetables, fruits, whole grains, and low-fat dairy products in a heart healthy meal plan. --     Comment -- Weight has been maintained so far. He continues to make heart healthy choices per conversation with RD. Weight has been maintained so far. He continues to make healthy choices per  conversation with RD.       Personal Goal #2 Re-Evaluation   Personal Goal #2 Pt to maintain his weight Pt to maintain his weight --            Nutrition Goals Discharge (Final Nutrition Goals Re-Evaluation):  Nutrition Goals Re-Evaluation - 09/09/20 0752      Goals   Comment Weight has been maintained so far. He continues to make healthy choices per conversation with RD.           Psychosocial: Target Goals: Acknowledge presence or absence of significant depression and/or stress, maximize coping skills, provide positive support system. Participant is able to verbalize types and ability to use techniques and skills needed for reducing stress and depression.  Initial Review & Psychosocial Screening:  Initial Psych Review & Screening - 08/02/20 1420      Initial  Review   Current issues with None Identified      Family Dynamics   Good Support System? Yes      Barriers   Psychosocial barriers to participate in program There are no identifiable barriers or psychosocial needs.;The patient should benefit from training in stress management and relaxation.      Screening Interventions   Interventions Encouraged to exercise           Quality of Life Scores:  Scores of 19 and below usually indicate a poorer quality of life in these areas.  A difference of  2-3 points is a clinically meaningful difference.  A difference of 2-3 points in the total score of the Quality of Life Index has been associated with significant improvement in overall quality of life, self-image, physical symptoms, and general health in studies assessing change in quality of life.  PHQ-9: Recent Review Flowsheet Data    Depression screen [facility] 2/9 08/02/2020 08/02/2020   Decreased Interest 0 0   Down, Depressed, Hopeless 0 0   PHQ - 2 Score 0 0   Altered sleeping 1 -   Tired, decreased energy 0 -   Change in appetite 0 -   Feeling bad or failure about yourself  0 -   Trouble concentrating 1 -   Moving  slowly or fidgety/restless 2 -   Suicidal thoughts 0 -   PHQ-9 Score 4 -   Difficult doing work/chores Not difficult at all -     Interpretation of Total Score  Total Score Depression Severity:  1-4 = Minimal depression, 5-9 = Mild depression, 10-14 = Moderate depression, 15-19 = Moderately severe depression, 20-27 = Severe depression   Psychosocial Evaluation and Intervention:  Psychosocial Evaluation - 08/18/20 1158      Psychosocial Evaluation & Interventions   Interventions Encouraged to exercise with the program and follow exercise prescription    Comments Roger Sims has a  supportive wife/caregiver strong faith community. Pt has memory issues and relies upon his wife for assistance.  This assistance that is needed  at times can be a source of discord between the two.    Expected Outcomes Roger Sims will be accepting of the needed assistance from his wife.           Psychosocial Re-Evaluation:  Psychosocial Re-Evaluation    [place] Name 08/18/20 1203 09/13/20 1318           Psychosocial Re-Evaluation   Current issues with None Identified None Identified      Comments Pt has memory issues and distracts easily No psychosocial concerns identified at this time.      Expected Outcomes Roger Sims will be accepting of the needed assistance from his wife For Roger Sims to continue to be free of psychosocial concerns while participating in pulmonary rehab.      Continue Psychosocial Services  Follow up required by staff No Follow up required             Psychosocial Discharge (Final Psychosocial Re-Evaluation):  Psychosocial Re-Evaluation - 09/13/20 1318      Psychosocial Re-Evaluation   Current issues with None Identified    Comments No psychosocial concerns identified at this time.    Expected Outcomes For Roger Sims to continue to be free of psychosocial concerns while participating in pulmonary rehab.    Continue Psychosocial Services  No Follow up required           Education: Education Goals:  Education classes will be provided on a weekly basis, covering required  topics. Participant will state understanding/return demonstration of topics presented.  Learning Barriers/Preferences:  Learning Barriers/Preferences - 08/02/20 1421      Learning Barriers/Preferences   Learning Barriers Hearing;Sight;Inability to learn new things   memory issues   Learning Preferences Verbal Instruction;Written Material;Individual Instruction           Education Topics: Risk Factor Reduction:  -Group instruction that is supported by a PowerPoint presentation. Instructor discusses the definition of a risk factor, different risk factors for pulmonary disease, and how the heart and lungs work together.     PULMONARY REHAB OTHER RESPIRATORY from 09/09/2020 in [place]  Date 08/19/20  Educator Handout  Instruction Review Code 1- Verbalizes Understanding      Nutrition for Pulmonary Patient:  -Group instruction provided by PowerPoint slides, verbal discussion, and written materials to support subject matter. The instructor gives an explanation and review of healthy diet recommendations, which includes a discussion on weight management, recommendations for fruit and vegetable consumption, as well as protein, fluid, caffeine, fiber, sodium, sugar, and alcohol. Tips for eating when patients are short of breath are discussed.   Pursed Lip Breathing:  -Group instruction that is supported by demonstration and informational handouts. Instructor discusses the benefits of pursed lip and diaphragmatic breathing and detailed demonstration on how to preform both.     Oxygen Safety:  -Group instruction provided by PowerPoint, verbal discussion, and written material to support subject matter. There is an overview of "What is Oxygen" and "Why do we need it".  Instructor also reviews how to create a safe environment for oxygen use, the importance of using oxygen as prescribed, and the  risks of noncompliance. There is a brief discussion on traveling with oxygen and resources the patient may utilize.   Oxygen Equipment:  -Group instruction provided by [facility] Staff utilizing handouts, written materials, and equipment demonstrations.   Signs and Symptoms:  -Group instruction provided by written material and verbal discussion to support subject matter. Warning signs and symptoms of infection, stroke, and heart attack are reviewed and when to call the physician/911 reinforced. Tips for preventing the spread of infection discussed.   Advanced Directives:  -Group instruction provided by verbal instruction and written material to support subject matter. Instructor reviews Advanced Directive laws and proper instruction for filling out document.   Pulmonary Video:  -Group video education that reviews the importance of medication and oxygen compliance, exercise, good nutrition, pulmonary hygiene, and pursed lip and diaphragmatic breathing for the pulmonary patient.   Exercise for the Pulmonary Patient:  -Group instruction that is supported by a PowerPoint presentation. Instructor discusses benefits of exercise, core components of exercise, frequency, duration, and intensity of an exercise routine, importance of utilizing pulse oximetry during exercise, safety while exercising, and options of places to exercise outside of rehab.     PULMONARY REHAB OTHER RESPIRATORY from 09/09/2020 in [place]  Date 08/26/20  Educator Handout  Instruction Review Code 1- Verbalizes Understanding      Pulmonary Medications:  -Verbally interactive group education provided by instructor with focus on inhaled medications and proper administration.   Anatomy and Physiology of the Respiratory System and Intimacy:  -Group instruction provided by PowerPoint, verbal discussion, and written material to support subject matter. Instructor reviews respiratory cycle and  anatomical components of the respiratory system and their functions. Instructor also reviews differences in obstructive and restrictive respiratory diseases with examples of each. Intimacy, Sex, and Sexuality differences are reviewed with  a discussion on how relationships can change when diagnosed with pulmonary disease. Common sexual concerns are reviewed.   MD DAY -A group question and answer session with a medical doctor that allows participants to ask questions that relate to their pulmonary disease state.   OTHER EDUCATION -Group or individual verbal, written, or video instructions that support the educational goals of the pulmonary rehab program.   PULMONARY REHAB OTHER RESPIRATORY from 09/09/2020 in [place]  Date 09/02/20  Educator Ailene Ravel Handout  [Nutrition Label Reading ]  Instruction Review Code 1- Verbalizes Understanding      Holiday Eating Survival Tips:  -Group instruction provided by PowerPoint slides, verbal discussion, and written materials to support subject matter. The instructor gives patients tips, tricks, and techniques to help them not only survive but enjoy the holidays despite the onslaught of food that accompanies the holidays.   Knowledge Questionnaire Score:  Knowledge Questionnaire Score - 08/02/20 1638      Knowledge Questionnaire Score   Pre Score 14/18           Core Components/Risk Factors/Patient Goals at Admission:  Personal Goals and Risk Factors at Admission - 08/02/20 1424      Core Components/Risk Factors/Patient Goals on Admission    Weight Management Weight Maintenance;Yes    Improve shortness of breath with ADL's Yes    Intervention Provide education, individualized exercise plan and daily activity instruction to help decrease symptoms of SOB with activities of daily living.    Expected Outcomes Short Term: Improve cardiorespiratory fitness to achieve a reduction of symptoms when performing ADLs;Long  Term: Be able to perform more ADLs without symptoms or delay the onset of symptoms    Lipids Yes    Intervention Provide education and support for participant on nutrition & aerobic/resistive exercise along with prescribed medications to achieve LDL <79m, HDL >462m   statin intolerance   Expected Outcomes Short Term: Participant states understanding of desired cholesterol values and is compliant with medications prescribed. Participant is following exercise prescription and nutrition guidelines.;Long Term: Cholesterol controlled with medications as prescribed, with individualized exercise RX and with personalized nutrition plan. Value goals: LDL < 705mHDL > 40 mg.           Core Components/Risk Factors/Patient Goals Review:   Goals and Risk Factor Review    Row Name 08/18/20 1204 09/13/20 1320           Core Components/Risk Factors/Patient Goals Review   Personal Goals Review Weight Management/Obesity;Develop more efficient breathing techniques such as purse lipped breathing and diaphragmatic breathing and practicing self-pacing with activity.;Increase knowledge of respiratory medications and ability to use respiratory devices properly.;Improve shortness of breath with ADL's Weight Management/Obesity;Increase knowledge of respiratory medications and ability to use respiratory devices properly.;Develop more efficient breathing techniques such as purse lipped breathing and diaphragmatic breathing and practicing self-pacing with activity.      Review Roger Sims completed 3 exercise sessions.  Pt is maintaining his weight between 75-76 kg.  [place] the breathing techniques and demonstrates with verbal cues and reminders - due to memory issues and distractability.  Anticipate pt will continue to show progress with exercise pt averages 10 laps on the track and 2.0 METS on the nustep with level 3. Roger Sims to maintain his weight, just started on Ofev 1 week ago, is tolerating it well so  far.  He has increased his strength and stamina, he is exercising at level 4 on the nustep and walking  7-11 laps on the track.  He is purse lip breathing independently.      Expected Outcomes See Admission Goals For Roger Sims to continue to improve his strength, stamina, and shortness of breath as he continues to exercise in pulmonary rehab.             Core Components/Risk Factors/Patient Goals at Discharge (Final Review):   Goals and Risk Factor Review - 09/13/20 1320      Core Components/Risk Factors/Patient Goals Review   Personal Goals Review Weight Management/Obesity;Increase knowledge of respiratory medications and ability to use respiratory devices properly.;Develop more efficient breathing techniques such as purse lipped breathing and diaphragmatic breathing and practicing self-pacing with activity.    Review Roger Sims continues to maintain his weight, just started on Ofev 1 week ago, is tolerating it well so far.  He has increased his strength and stamina, he is exercising at level 4 on the nustep and walking 7-11 laps on the track.  He is purse lip breathing independently.    Expected Outcomes For Roger Sims to continue to improve his strength, stamina, and shortness of breath as he continues to exercise in pulmonary rehab.           ITP Comments:   Comments: ITP REVIEW Pt is making expected progress toward pulmonary rehab goals after completing 10 sessions. Recommend continued exercise, life style modification, education, and utilization of breathing techniques to increase stamina and strength and decrease shortness of breath with exertion.

## 2020-09-14 ENCOUNTER — Other Ambulatory Visit: Payer: Self-pay

## 2020-09-14 ENCOUNTER — Encounter (HOSPITAL_COMMUNITY)
Admission: RE | Admit: 2020-09-14 | Discharge: 2020-09-14 | Disposition: A | Discharge status: Home / Self Care | Payer: Medicare Other | Source: Ambulatory Visit | Attending: Internal Medicine | Admitting: Internal Medicine

## 2020-09-14 DIAGNOSIS — J84112 Idiopathic pulmonary fibrosis: Secondary | ICD-10-CM

## 2020-09-14 NOTE — Progress Notes (Signed)
Daily Session Note  Patient Details  Name: Roger Sims MRN: [id number] Date of Birth: 06/30/1946 Referring Provider:     Pulmonary Rehab Walk Test from 08/02/2020 in [place]  Referring Provider Dr. Chase Caller      Encounter Date: 09/14/2020  Check In:  Session Check In - 09/14/20 1348      Check-In   Supervising physician immediately available to respond to emergencies [facility] immediately available    Physician(s) Dr. Wynelle Cleveland    Location [facility]    Staff Present Rosebud Poles, RN, BSN;Elwood Bazinet Ysidro Evert, RN;Jessica Hassell Done, MS, ACSM-CEP, Exercise Physiologist    Virtual Visit No    Medication changes reported     No    Fall or balance concerns reported    No    Tobacco Cessation No Change    Warm-up and Cool-down Performed as group-led instruction    Resistance Training Performed Yes    VAD Patient? No    PAD/SET Patient? No      Pain Assessment   Currently in Pain? No/denies    Multiple Pain Sites No           Capillary Blood Glucose: No results found for this or any previous visit (from the past 24 hour(s)).    Social History   Tobacco Use  Smoking Status Former Smoker  . Packs/day: 1.50  . Types: Cigarettes  Smokeless Tobacco Former Systems developer  Tobacco Comment   30 years ago    Goals Met:  Exercise tolerated well No report of cardiac concerns or symptoms Strength training completed today  Goals Unmet:  Not Applicable  Comments: Service time is from 1315 to 1455    Dr. Fransico Him is Medical Director for Cardiac Rehab at [facility].

## 2020-09-15 ENCOUNTER — Ambulatory Visit: Payer: Medicare Other | Admitting: Pulmonary Disease

## 2020-09-15 ENCOUNTER — Encounter: Payer: Self-pay | Admitting: Pulmonary Disease

## 2020-09-15 ENCOUNTER — Ambulatory Visit: Payer: Medicare Other | Admitting: Internal Medicine

## 2020-09-15 ENCOUNTER — Ambulatory Visit: Payer: Medicare Other | Admitting: Adult Health

## 2020-09-15 VITALS — BP 120/70 | HR 71 | Temp 97.3°F | Ht 72.0 in | Wt 169.6 lb

## 2020-09-15 DIAGNOSIS — Z0000 Encounter for general adult medical examination without abnormal findings: Secondary | ICD-10-CM | POA: Insufficient documentation

## 2020-09-15 DIAGNOSIS — J84112 Idiopathic pulmonary fibrosis: Secondary | ICD-10-CM | POA: Diagnosis not present

## 2020-09-15 DIAGNOSIS — J439 Emphysema, unspecified: Secondary | ICD-10-CM | POA: Insufficient documentation

## 2020-09-15 DIAGNOSIS — R634 Abnormal weight loss: Secondary | ICD-10-CM | POA: Insufficient documentation

## 2020-09-15 DIAGNOSIS — J9611 Chronic respiratory failure with hypoxia: Secondary | ICD-10-CM | POA: Insufficient documentation

## 2020-09-15 NOTE — Assessment & Plan Note (Signed)
We will reweigh the patient today I have encouraged patient to continue to monitor his weight Can start supplementing Ensure high-protein boost in between meals, not as meal replacement

## 2020-09-15 NOTE — Patient Instructions (Addendum)
You were seen today by Lauraine Rinne, NP  for:   1. IPF (idiopathic pulmonary fibrosis) ([place])  - Hepatic function panel; Standing  Lab work today  You will need to obtain monthly lab work, we have placed this order, you could present to [place] to obtain lab work  [employer]  We will repeat pulmonary function testing in 6 months-spirometry with DLCO  Continue to work hard on increasing your physical activity and pulmonary rehab  2. Chronic respiratory failure with hypoxia (H[facility])  Walk today in office >>> Qualified for POC with 2 L pulsed  Continue oxygen therapy as prescribed  >>>maintain oxygen saturations greater than 88 percent  >>>if unable to maintain oxygen saturations please contact the office  >>>do not smoke with oxygen  >>>can use nasal saline gel or nasal saline rinses to moisturize nose if oxygen causes dryness   3. Pulmonary emphysema, unspecified emphysema type ([place])  Continue pulmonary rehab  4. Weight loss, unintentional  We will reweigh you today  Continue to monitor weights since you have started Esbriet  Okay to start high-protein Ensure or boost in between meals, not as meal replacements  5. Healthcare maintenance  Glad you are up-to-date with COVID-19 vaccinations and seasonal flu vaccination  Would strongly encourage that anyone who is around you obtain vaccinations for the seasonal flu as well as COVID-19 given the severity of your lung disease.  Would strongly encouraged to continue to wear a mask as well as handwashing especially if you are around unvaccinated individuals   We recommend today:  Orders Placed This Encounter  Procedures  . Hepatic function panel    LFT drawn monthly for 6 months    Standing Status:   Standing    Number of Occurrences:   6    Standing Expiration Date:   09/15/2021   Orders Placed This Encounter  Procedures  . Hepatic function panel   No orders of the defined types were placed in  this encounter.   Follow Up:    Return in about 3 months (around 12/16/2020), or if symptoms worsen or fail to improve, for Follow up with Dr. Purnell Shoemaker, [facility] clinic - 34[address] slot. Schedule spirometry with DLCO-30-minute breathing test-in 6 months  Notification of test results are managed in the following manner: If there are  any recommendations or changes to the  plan of care discussed in office today,  we will contact you and let you know what they are. If you do not hear from [facility] then your results are normal and you can view them through your  MyChart account , or a letter will be sent to you. Thank you again for trusting [facility] your care  - Thank you, [facility]    It is flu season:   >>> Best ways to protect herself from the flu: Receive the yearly flu vaccine, practice good hand hygiene washing with soap and also using hand sanitizer when available, eat a nutritious meals, get adequate rest, hydrate appropriately       Please contact the office if your symptoms worsen or you have concerns that you are not improving.   Thank you for choosing [facility] for your healthcare, and for allowing [facility] partner with you on your healthcare journey. I am thankful to be able to provide care to you today.   BWyn QuakerFNP-C

## 2020-09-15 NOTE — Assessment & Plan Note (Signed)
Plan: Walk today in office Walk for POC today Continue oxygen therapy Maintain oxygen saturations above 88%

## 2020-09-15 NOTE — Assessment & Plan Note (Signed)
Great job being up-to-date with your COVID-19 vaccinations and flu vaccine Continue pulmonary rehab Continue to avoid being around individuals who are unvaccinated especially without a mask If around unvaccinated individuals please ensure they are not having acute illnesses, and please wear a mask, and strongly encourage that those individuals also wear a mask Given the severity of your lung disease you are at high risk of complications should you contract the flu or COVID-19

## 2020-09-15 NOTE — Assessment & Plan Note (Addendum)
Clinical diagnosis of IPF Connective tissue serology negative Maintained on Esbriet  Plan: Continue Esbriet Lab work today Patient will need lab work every month for the next 6 months since starting Esbriet to monitor for liver functioning Up-to-date with COVID-19 vaccinations and flu vaccine Walk today in office Continue oxygen therapy Follow-up in our office in 3 months Spirometry with DLCO in 6 months Continue pulmonary rehab

## 2020-09-15 NOTE — Progress Notes (Signed)
_0  ID: Roylene Reason, male    DOB: October 12, 1946, 74 y.o.   MRN: [id number]  Chief Complaint  Patient presents with  . Follow-up    IPF    Referring provider: Thomes Dinning, MD  HPI:  74 year old male former smoker followed in our office for IPF  PMH: Physical deconditioning, chronic respiratory failure, pulmonary emphysema Smoker/ Smoking History: Former smoker Maintenance: Esbriet Pt of: Dr. Chase Caller  09/15/2020  - Visit   74 year old male former smoker followed in our office for IPF.  Established with Dr. Chase Caller.  Last completed follow-up with Dr. Chase Caller in October/2021.  Plan of care at that office visit was to start treatment with Levaquin for 5 days as well as prednisone, a 6-minute walk was ordered as well as a spirometry with DLCO.  Is recommended that patient follow-up with primary care due to memory loss and to obtain a referral to a neurologist.  He was encouraged to start pirfenidone.  We will need to monitor for weight loss.  Patient presenting to our office today.  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  He has been tolerating this well.  He did have a case of upset stomach this morning where he had three loose bowel movements.  He had not had any symptoms of diarrhea prior to this.  He is unsure if this may be related to the Esbriet or to the [place] that he had yesterday evening.  He feels that it may have been the [employer].  Patient continues to work hard in pulmonary rehab.  He is up-to-date with his COVID-19 vaccinations as well as his flu vaccine.  Unfortunately he has family members that he lives with who are unvaccinated.  He is aware that he should wear a mask.  Unfortunately this puts the patient at a high risk of exposure especially during the holiday season.  Patient presenting to our office today on oxygen.  He is interested in potentially qualifying for a portable oxygen concentrator.  We will discuss this today.  Questionaires / Pulmonary  Flowsheets:   ACT:  No flowsheet data found.  MMRC: mMRC Dyspnea Scale mMRC Score  09/15/2020 1    Epworth:  No flowsheet data found.  Tests:   07/08/2020-connective tissue labs-negative  07/08/2020-ANA-positive, titer 1: 40, considered negative  07/08/20 -ILD questionnaire Past Medical History : Positive history for COPD this was diagnosed in the CT scan of the chest.  I reviewed the medical records.  There is mention.  Is not on any treatment for this.  He reported positive for mild kidney disease for the last few years.  He had a heart attack in 2011 and has a coronary artery stent.  Otherwise negative for collagen vascular disease or tuberculosis or vasculitis.   ROS: Positive for fatigue.  Positive for sicca symptoms.  Positive for 20 pound weight loss in the last several months.  Positive for excessive tiredness   FAMILY HISTORY of LUNG DISEASE: Completely denies   EXPOSURE HISTORY: Smokes cigarettes between 1962 and 1982.  20 cigarettes/day.  Currently not a smoker.  Does not smoke cigars.  No smoking pipes.  No passive smoking.  No vaping.  No marijuana use no cocaine use no intravenous drug use.   HOME and HOBBY DETAILS : Currently lives in a townhouse for the last 7 years.  Age of the townhome for 7 years.  Negative history for any organic antigen exposure in the house.  No dampness.  No mildew or shower curtain.  No humidifier.  No CPAP use no nebulizer use.  No steam iron use.  No Jacuzzi use.  No misting Fountain.  No pet birds or parakeets.  No pet gerbils.  No mold in the [facility] duct.  No music habits.  No gardening habits.   OCCUPATIONAL HISTORY (122 questions) : Exposure history for organic antigens he worked in [employer] he was in the [place] War.  He did airplane work and Furniture conservator/restorer work and.  While in the [place] was also exposed to gas fumes and chemicals.  Otherwise history is negative.   PULMONARY TOXICITY HISTORY (27 items): Denies.   Pulmonary  function test conducted 06/03/2020 [facility] shows minimal obstruction with mild restriction and severe decrease in diffusing capacity. He showed an insignificant response to bronchodilator. His ratio was 73%, FEV1 59%, FVC 60% with DLCO 34%. There appears to be an obstructive and restrictive pattern  04/27/2020-CT chest without contrast-mild to moderate severity diffuse bilateral fibrotic changes, marked severity coronary artery calcification, colonic diverticulitis, aortic arthrosclerosis  09/02/2020-pulmonary function test-spirometry with DLCO-FVC 3.09 (66% predicted), ratio 83, FEV1 2.62 (78% predicted), DLCO 14.08 (52% predicted)  FENO:  No results found for: NITRICOXIDE  PFT: PFT Results Latest Ref Rng & Units 09/02/2020  FVC-Pre L 3.09  FVC-Predicted Pre % 66  Pre FEV1/FVC % % 85  FEV1-Pre L 2.62  FEV1-Predicted Pre % 78  DLCO uncorrected ml/min/mmHg 14.08  DLCO UNC% % 52  DLCO corrected ml/min/mmHg 14.08  DLCO COR %Predicted % 52  DLVA Predicted % 76    WALK:  SIX MIN WALK 08/02/2020  2 Minute Oxygen Saturation % 90  2 Minute HR 95  4 Minute Oxygen Saturation % 87  4 Minute HR 92  6 Minute Oxygen Saturation % 92  6 Minute HR 114    Imaging: No results found.  Lab Results:  CBC    Component Value Date/Time   WBC 9.5 06/16/2010 1620   RBC 5.23 06/16/2010 1620   HGB 16.3 06/16/2010 1620   HCT 48.1 06/16/2010 1620   PLT 156 06/16/2010 1620   MCV 92.1 06/16/2010 1620   MCH 31.1 06/16/2010 1620   MCHC 33.8 06/16/2010 1620   RDW 13.5 06/16/2010 1620    BMET    Component Value Date/Time   NA 141 06/16/2010 1620   K 3.9 06/16/2010 1620   CL 104 06/16/2010 1620   CO2 28 06/16/2010 1620   GLUCOSE 123 (H) 06/16/2010 1620   BUN 20 06/16/2010 1620   CREATININE 1.1 06/16/2010 1620   CALCIUM 9.1 06/16/2010 1620   GFRNONAA >60 06/16/2010 1620   GFRAA  06/16/2010 1620    >60        The eGFR has been calculated using the MDRD equation. This calculation has not  been validated in all clinical situations. eGFR's persistently <60 mL/min signify possible Chronic Kidney Disease.    BNP No results found for: BNP  ProBNP No results found for: PROBNP  Specialty Problems      Pulmonary Problems   Chronic respiratory failure with hypoxia (HCC)   IPF (idiopathic pulmonary fibrosis) (HCC)   Pulmonary emphysema (HCC)      Allergies  Allergen Reactions  . Penicillins Swelling  . Tetracyclines & Related Anaphylaxis    Gi upset Gi upset   . Metoprolol Tartrate Rash    itching itching   . Prasugrel Rash    Immunization History  Administered Date(s) Administered  . Influenza, High Dose Seasonal PF 07/28/2014, 07/20/2015, 07/01/2019  .  PFIZER SARS-COV-2 Vaccination 12/14/2019, 01/06/2020  . Pneumococcal Conjugate-13 03/12/2015  . Pneumococcal Polysaccharide-23 08/30/2011  . Tdap 02/10/2009  . Zoster 02/15/2012   Got flu vaccine 10d ago  States he has received his COVID-19 booster  History reviewed. No pertinent past medical history.  Tobacco History: Social History   Tobacco Use  Smoking Status Former Smoker  . Packs/day: 1.50  . Types: Cigarettes  Smokeless Tobacco Former Systems developer  Tobacco Comment   30 years ago   Counseling given: Yes Comment: 30 years ago   Continue to not smoke  Outpatient Encounter Medications as of 09/15/2020  Medication Sig  . ESBRIET 267 MG TABS Take by mouth.   . latanoprost (XALATAN) 0.005 % ophthalmic solution   . nebivolol (BYSTOLIC) 10 MG tablet Take 1 tablet by mouth daily.  . predniSONE (STERAPRED UNI-PAK 21 TAB) 10 MG (21) TBPK tablet Take by mouth daily. Take 68m for 2 days, 379mfor 2 days then 2074mor 2 days then 32m11mr 2 days the 5mg 1m 2 days then stop  . [DISCONTINUED] aspirin 81 MG EC tablet Take by mouth.   . [DISCONTINUED] levofloxacin (LEVAQUIN) 500 MG tablet Take 1 tablet (500 mg total) by mouth daily. Take 500mg 63my for 5 days  . [DISCONTINUED] Tiotropium Bromide  Monohydrate (SPIRIVA RESPIMAT) 1.25 MCG/ACT AERS Inhale 2 puffs into the lungs daily.   No facility-administered encounter medications on file as of 09/15/2020.     Review of Systems  Review of Systems  Constitutional: Positive for fatigue. Negative for activity change, chills, fever and unexpected weight change.  HENT: Negative for postnasal drip, rhinorrhea, sinus pressure, sinus pain and sore throat.   Eyes: Negative.   Respiratory: Positive for shortness of breath. Negative for cough and wheezing.   Cardiovascular: Negative for chest pain and palpitations.  Gastrointestinal: Negative for constipation, diarrhea, nausea and vomiting.  Endocrine: Negative.   Genitourinary: Negative.   Musculoskeletal: Negative.   Skin: Negative.   Neurological: Negative for dizziness and headaches.  Psychiatric/Behavioral: Negative.  Negative for dysphoric mood. The patient is not nervous/anxious.   All other systems reviewed and are negative.    Physical Exam  BP 120/70 (BP Location: Left Arm, Cuff Size: Normal)   Pulse 71   Temp (!) 97.3 F (36.3 C) (Oral)   Ht 6' (1.829 m)   Wt 169 lb 9.6 oz (76.9 kg)   SpO2 99%   BMI 23.00 kg/m   Wt Readings from Last 5 Encounters:  09/15/20 169 lb 9.6 oz (76.9 kg)  09/07/20 166 lb 14.2 oz (75.7 kg)  08/24/20 166 lb 0.1 oz (75.3 kg)  08/19/20 165 lb (74.8 kg)  08/05/20 174 lb (78.9 kg)    BMI Readings from Last 5 Encounters:  09/15/20 23.00 kg/m  09/07/20 22.63 kg/m  08/24/20 22.51 kg/m  08/19/20 22.38 kg/m  08/05/20 23.60 kg/m     Physical Exam Vitals and nursing note reviewed.  Constitutional:      General: He is not in acute distress.    Appearance: Normal appearance. He is normal weight.  HENT:     Head: Normocephalic and atraumatic.     Right Ear: Hearing and external ear normal.     Left Ear: Hearing and external ear normal.     Nose: Nose normal. No mucosal edema or rhinorrhea.     Right Turbinates: Not enlarged.      Left Turbinates: Not enlarged.     Mouth/Throat:     Mouth: Mucous membranes are dry.  Pharynx: Oropharynx is clear. No oropharyngeal exudate.  Eyes:     Pupils: Pupils are equal, round, and reactive to light.  Cardiovascular:     Rate and Rhythm: Normal rate and regular rhythm.     Pulses: Normal pulses.     Heart sounds: Normal heart sounds. No murmur heard.   Pulmonary:     Effort: Pulmonary effort is normal.     Breath sounds: Rales (bibasilar rales) present. No decreased breath sounds or wheezing.  Musculoskeletal:     Cervical back: Normal range of motion.     Right lower leg: No edema.     Left lower leg: No edema.  Lymphadenopathy:     Cervical: No cervical adenopathy.  Skin:    General: Skin is warm and dry.     Capillary Refill: Capillary refill takes less than 2 seconds.     Findings: No erythema or rash.  Neurological:     General: No focal deficit present.     Mental Status: He is alert and oriented to person, place, and time.     Motor: No weakness.     Coordination: Coordination normal.     Gait: Gait is intact. Gait normal.  Psychiatric:        Mood and Affect: Mood normal.        Behavior: Behavior normal. Behavior is cooperative.        Thought Content: Thought content normal.        Judgment: Judgment normal.       Assessment & Plan:   Chronic respiratory failure with hypoxia (HCC) Plan: Walk today in office Walk for POC today Continue oxygen therapy Maintain oxygen saturations above 88%  IPF (idiopathic pulmonary fibrosis) (HCC) Clinical diagnosis of IPF Connective tissue serology negative Maintained on Esbriet  Plan: Continue Esbriet Lab work today Patient will need lab work every month for the next 6 months since starting Esbriet to monitor for liver functioning Up-to-date with COVID-19 vaccinations and flu vaccine Walk today in office Continue oxygen therapy Follow-up in our office in 3 months Spirometry with DLCO in 6  months Continue pulmonary rehab  Pulmonary emphysema ([place]) Plan: Continue clinically monitor Continue pulmonary rehab  Healthcare maintenance Great job being up-to-date with your COVID-19 vaccinations and flu vaccine Continue pulmonary rehab Continue to avoid being around individuals who are unvaccinated especially without a mask If around unvaccinated individuals please ensure they are not having acute illnesses, and please wear a mask, and strongly encourage that those individuals also wear a mask Given the severity of your lung disease you are at high risk of complications should you contract the flu or COVID-19  Weight loss, unintentional We will reweigh the patient today I have encouraged patient to continue to monitor his weight Can start supplementing Ensure high-protein boost in between meals, not as meal replacement    Return in about 3 months (around 12/16/2020), or if symptoms worsen or fail to improve, for Follow up with Dr. Purnell Shoemaker, [facility] clinic - 46mn slot.   BLauraine Rinne NP 09/15/2020   This appointment required 35 minutes of patient care (this includes precharting, chart review, review of results, face-to-face care, etc.).

## 2020-09-15 NOTE — Assessment & Plan Note (Signed)
Plan: Continue clinically monitor Continue pulmonary rehab

## 2020-09-20 ENCOUNTER — Telehealth: Payer: Self-pay | Admitting: Pulmonary Disease

## 2020-09-20 ENCOUNTER — Other Ambulatory Visit: Payer: Self-pay | Admitting: Pulmonary Disease

## 2020-09-21 ENCOUNTER — Other Ambulatory Visit: Payer: Self-pay

## 2020-09-21 ENCOUNTER — Encounter (HOSPITAL_COMMUNITY)
Admission: RE | Admit: 2020-09-21 | Discharge: 2020-09-21 | Disposition: A | Discharge status: Home / Self Care | Payer: Medicare Other | Source: Ambulatory Visit | Attending: Internal Medicine | Admitting: Internal Medicine

## 2020-09-21 VITALS — Wt 165.3 lb

## 2020-09-21 DIAGNOSIS — J84112 Idiopathic pulmonary fibrosis: Secondary | ICD-10-CM

## 2020-09-21 LAB — HEPATIC FUNCTION PANEL
ALT: 15 IU/L (ref 0–44)
AST: 20 IU/L (ref 0–40)
Albumin: 4.2 g/dL (ref 3.7–4.7)
Alkaline Phosphatase: 75 IU/L (ref 44–121)
Bilirubin Total: 0.7 mg/dL (ref 0.0–1.2)
Bilirubin, Direct: 0.24 mg/dL (ref 0.00–0.40)
Total Protein: 6.4 g/dL (ref 6.0–8.5)

## 2020-09-21 NOTE — Telephone Encounter (Signed)
Patient called back to the office to let us know that he got blood work drawn yesterday at a cardiology office and is wondering if he needs to repeat it. Advised that the office he went to was a Financial controller office. Looks like blood work is completed at this time. Advised patient he is good to go to the office to get his labs done. He expressed understanding. Nothing further needed at this time.

## 2020-09-21 NOTE — Progress Notes (Signed)
Daily Session Note  Patient Details  Name: Roger Sims MRN: [id number] Date of Birth: 19-Jul-1946 Referring Provider:     Pulmonary Rehab Walk Test from 08/02/2020 in [place]  Referring Provider Dr. Chase Caller      Encounter Date: 09/21/2020  Check In:  Session Check In - 09/21/20 1424      Check-In   Supervising physician immediately available to respond to emergencies [facility] immediately available    Physician(s) Dr. Tyrell Antonio    Location [facility]    Staff Present Rosebud Poles, RN, BSN;Lisa Ysidro Evert, RN;Einar Nolasco Hassell Done, MS, ACSM-CEP, Exercise Physiologist    Virtual Visit No    Medication changes reported     No    Fall or balance concerns reported    No    Tobacco Cessation No Change    Warm-up and Cool-down Performed on first and last piece of equipment    Resistance Training Performed Yes    VAD Patient? No    PAD/SET Patient? No      Pain Assessment   Currently in Pain? No/denies    Multiple Pain Sites No           Capillary Blood Glucose: No results found for this or any previous visit (from the past 24 hour(s)).   Exercise Prescription Changes - 09/21/20 1500      Response to Exercise   Blood Pressure (Admit) 104/60    Blood Pressure (Exercise) 140/70    Blood Pressure (Exit) 102/60    Heart Rate (Admit) 72 bpm    Heart Rate (Exercise) 89 bpm    Heart Rate (Exit) 76 bpm    Oxygen Saturation (Admit) 100 %    Oxygen Saturation (Exercise) 97 %    Oxygen Saturation (Exit) 98 %    Rating of Perceived Exertion (Exercise) 13    Perceived Dyspnea (Exercise) 2.5    Duration Continue with 45 min of aerobic exercise without signs/symptoms of physical distress.    Intensity THRR unchanged      Progression   Progression Continue to progress workloads to maintain intensity without signs/symptoms of physical distress.      Resistance Training   Training Prescription Yes    Weight blue bands    Reps  10-15    Time 10 Minutes      Oxygen   Oxygen Continuous    Liters 3      NuStep   Level 4    SPM 80    Minutes 15    METs 1.8           Social History   Tobacco Use  Smoking Status Former Smoker  . Packs/day: 1.50  . Types: Cigarettes  Smokeless Tobacco Former Systems developer  Tobacco Comment   30 years ago    Goals Met:  Proper associated with RPD/PD & O2 Sat Exercise tolerated well No report of cardiac concerns or symptoms Strength training completed today  Goals Unmet:  Not Applicable  Comments: Service time is from 1330 to 1445    Dr. Fransico Him is Medical Director for Cardiac Rehab at [facility].

## 2020-09-21 NOTE — Telephone Encounter (Signed)
Called and spoke with patient's wife per DPR. Let her know that the lab at [facility] uses a different system than [place] and they were not able to see the order that we placed. Called and spoke with lab at [facility] and was informed that they are not able to see orders that we put in and that they have to be faxed over. Was provided with fax number [phone]. Order was printed and faxed over. Wife was informed. She expressed understanding. Nothing further needed at this time.

## 2020-09-23 ENCOUNTER — Encounter (HOSPITAL_COMMUNITY): Payer: Medicare Other

## 2020-09-23 ENCOUNTER — Telehealth (HOSPITAL_COMMUNITY): Payer: Self-pay | Admitting: Internal Medicine

## 2020-09-28 ENCOUNTER — Other Ambulatory Visit: Payer: Self-pay

## 2020-09-28 ENCOUNTER — Encounter (HOSPITAL_COMMUNITY)
Admission: RE | Admit: 2020-09-28 | Discharge: 2020-09-28 | Disposition: A | Discharge status: Home / Self Care | Payer: Medicare Other | Source: Ambulatory Visit | Attending: Internal Medicine | Admitting: Internal Medicine

## 2020-09-28 DIAGNOSIS — J84112 Idiopathic pulmonary fibrosis: Secondary | ICD-10-CM | POA: Insufficient documentation

## 2020-09-28 NOTE — Progress Notes (Signed)
Daily Session Note  Patient Details  Name: Roger Sims MRN: [id number] Date of Birth: [birth year] Referring Provider:     Pulmonary Rehab Walk Test from 08/02/2020 in [place]  Referring Provider Dr. Chase Caller      Encounter Date: 09/28/2020  Check In:  Session Check In - 09/28/20 1336      Check-In   Supervising physician immediately available to respond to emergencies [facility] immediately available    Physician(s) Dr. Cyndia Skeeters    [facility]    Staff Present Rosebud Poles, RN, Roque Cash, RN    Virtual Visit No    Medication changes reported     No    Fall or balance concerns reported    No    Tobacco Cessation No Change    Warm-up and Cool-down Performed on first and last piece of equipment    Resistance Training Performed Yes    VAD Patient? No    PAD/SET Patient? No      Pain Assessment   Currently in Pain? No/denies    Multiple Pain Sites No           Capillary Blood Glucose: No results found for this or any previous visit (from the past 24 hour(s)).    Social History   Tobacco Use  Smoking Status Former Smoker  . Packs/day: 1.50  . Types: Cigarettes  Smokeless Tobacco Former [place]   30 years ago    Goals Met:  Proper associated with RPD/PD & O2 Sat Exercise tolerated well Strength training completed today  Goals Unmet:  Not Applicable  Comments: Service time is from 1315 to 64   Dr. Fransico Him is Medical Director for Cardiac Rehab at [facility].

## 2020-09-30 ENCOUNTER — Other Ambulatory Visit: Payer: Self-pay

## 2020-09-30 ENCOUNTER — Encounter (HOSPITAL_COMMUNITY)
Admission: RE | Admit: 2020-09-30 | Discharge: 2020-09-30 | Disposition: A | Discharge status: Home / Self Care | Payer: Medicare Other | Source: Ambulatory Visit | Attending: Internal Medicine | Admitting: Internal Medicine

## 2020-09-30 VITALS — Wt 165.8 lb

## 2020-09-30 DIAGNOSIS — J84112 Idiopathic pulmonary fibrosis: Secondary | ICD-10-CM

## 2020-09-30 NOTE — Progress Notes (Signed)
Daily Session Note  Patient Details  Name: Roger Sims MRN: [id number] Date of Birth: 1945-12-07 Referring Provider:   April Manson Pulmonary Rehab Walk Test from 08/02/2020 in [place]  Referring Provider Dr. Chase Caller      Encounter Date: 09/30/2020  Check In:  Session Check In - 09/30/20 [place]      Check-In   Supervising physician immediately available to respond to emergencies [facility] immediately available    Physician(s) Dr. Cyndia Skeeters    [facility]    Staff Present Rosebud Poles, RN, BSN;Keyler Hoge Ysidro Evert, RN;Jessica Hassell Done, MS, ACSM-CEP, Exercise Physiologist    Virtual Visit No    Medication changes reported     No    Fall or balance concerns reported    No    Tobacco Cessation No Change    Warm-up and Cool-down Performed on first and last piece of equipment    Resistance Training Performed Yes    VAD Patient? No    PAD/SET Patient? No      Pain Assessment   Currently in Pain? No/denies    Multiple Pain Sites No           Capillary Blood Glucose: No results found for this or any previous visit (from the past 24 hour(s)).    Social History   Tobacco Use  Smoking Status Former Smoker  . Packs/day: 1.50  . Types: Cigarettes  Smokeless Tobacco Former Systems developer  Tobacco Comment   30 years ago    Goals Met:  Exercise tolerated well No report of cardiac concerns or symptoms Strength training completed today  Goals Unmet:  Not Applicable  Comments: Service time is from 1330 to 1455    Dr. Fransico Him is Medical Director for Cardiac Rehab at [facility].

## 2020-10-04 ENCOUNTER — Telehealth: Payer: Self-pay | Admitting: Internal Medicine

## 2020-10-04 MED ORDER — ESBRIET 267 MG PO TABS: 3.0000 | ORAL_TABLET | Freq: Three times a day (TID) | ORAL | 11 refills | Status: DC

## 2020-10-04 NOTE — Telephone Encounter (Signed)
rx was sent to pharmacy as requested. Nothing further needed at this time- will close encounter.

## 2020-10-04 NOTE — Telephone Encounter (Signed)
Spoke to [facility], patient is out of refills. Please escribe Esbriet maintenance dose to [facility].

## 2020-10-04 NOTE — Telephone Encounter (Signed)
I will have to follow up with Pharmacy on this as they deal with all specialty medications.   Rachael please advise

## 2020-10-04 NOTE — Telephone Encounter (Signed)
That is fine do we have samples of Esbriet

## 2020-10-05 ENCOUNTER — Other Ambulatory Visit: Payer: Self-pay

## 2020-10-05 ENCOUNTER — Encounter (HOSPITAL_COMMUNITY)
Admission: RE | Admit: 2020-10-05 | Discharge: 2020-10-05 | Disposition: A | Discharge status: Home / Self Care | Payer: Medicare Other | Source: Ambulatory Visit | Attending: Internal Medicine | Admitting: Internal Medicine

## 2020-10-05 VITALS — Wt 162.2 lb

## 2020-10-05 DIAGNOSIS — J84112 Idiopathic pulmonary fibrosis: Secondary | ICD-10-CM | POA: Diagnosis not present

## 2020-10-05 NOTE — Progress Notes (Signed)
Daily Session Note  Patient Details  Name: Roger Sims MRN: [id number] Date of Birth: [birth year] Referring Provider:   April Manson Pulmonary Rehab Walk Test from 08/02/2020 in [place]  Referring Provider Dr. Chase Caller      Encounter Date: 10/05/2020  Check In:  Session Check In - 10/05/20 1413      Check-In   Supervising physician immediately available to respond to emergencies [facility] immediately available    Physician(s) Dr. Cyndia Skeeters    [facility]    Staff Present Rosebud Poles, RN, BSN;Decklyn Hyder Ysidro Evert, RN;Jessica Hassell Done, MS, ACSM-CEP, Exercise Physiologist    Virtual Visit No    Medication changes reported     No    Fall or balance concerns reported    No    Tobacco Cessation No Change    Warm-up and Cool-down Performed on first and last piece of equipment    Resistance Training Performed Yes    VAD Patient? No    PAD/SET Patient? No      Pain Assessment   Currently in Pain? No/denies    Multiple Pain Sites No           Capillary Blood Glucose: No results found for this or any previous visit (from the past 24 hour(s)).   Exercise Prescription Changes - 10/05/20 1500      Response to Exercise   Blood Pressure (Admit) 120/74    Blood Pressure (Exercise) 122/70    Blood Pressure (Exit) 124/76    Heart Rate (Admit) 85 bpm    Heart Rate (Exercise) 108 bpm    Heart Rate (Exit) 92 bpm    Oxygen Saturation (Admit) 98 %    Oxygen Saturation (Exercise) 92 %    Oxygen Saturation (Exit) 97 %    Rating of Perceived Exertion (Exercise) 13    Perceived Dyspnea (Exercise) 2    Duration Continue with 30 min of aerobic exercise without signs/symptoms of physical distress.    Intensity THRR unchanged      Progression   Progression Continue to progress workloads to maintain intensity without signs/symptoms of physical distress.      Resistance Training   Training Prescription Yes    Weight blue bands     Reps 10-15    Time 10 Minutes      Oxygen   Oxygen Continuous    Liters 3      NuStep   Level 4    SPM 80    Minutes 30    METs 2.2           Social History   Tobacco Use  Smoking Status Former Smoker   Packs/day: 1.50   Types: Cigarettes  Smokeless Tobacco Former Systems developer  Tobacco Comment   30 years ago    Goals Met:  Exercise tolerated well No report of cardiac concerns or symptoms Strength training completed today  Goals Unmet:  Not Applicable  Comments: Service time is from 1330 to 1510    Dr. Fransico Him is Market researcher for Cardiac Rehab at [facility].

## 2020-10-05 NOTE — Progress Notes (Signed)
Pulmonary Individual Treatment Plan  Patient Details  Name: Roger Sims MRN: [id number] Date of Birth: [birth year] Referring Provider:   April Manson Pulmonary Rehab Walk Test from 08/02/2020 in [facility]  Referring Provider Dr. Chase Caller      Initial Encounter Date:  Flowsheet Row Pulmonary Rehab Walk Test from 08/02/2020 in [place]  Date 08/02/20      Visit Diagnosis: IPF (idiopathic pulmonary fibrosis) ([place])  Patient's Home Medications on Admission:   Current Outpatient Medications:    ESBRIET 267 MG TABS, Take 3 tablets (801 mg total) by mouth in the morning, at noon, and at bedtime., Disp: 270 tablet, Rfl: 11   latanoprost (XALATAN) 0.005 % ophthalmic solution, , Disp: , Rfl:    nebivolol (BYSTOLIC) 10 MG tablet, Take 1 tablet by mouth daily., Disp: , Rfl:    predniSONE (STERAPRED UNI-PAK 21 TAB) 10 MG (21) TBPK tablet, Take by mouth daily. Take 92m for 2 days, 3104mfor 2 days then 2066mor 2 days then 65m33mr 2 days the 5mg 41m 2 days then stop, Disp: 22 tablet, Rfl: 0  Past Medical History: No past medical history on file.  Tobacco Use: Social History   Tobacco Use  Smoking Status Former Smoker   Packs/day: 1.50   Types: Cigarettes  Smokeless Tobacco Former User Systems developeracco Comment   30 years ago    Labs: Recent Review Flowsheet Data   There is no flowsheet data to display.     Capillary Blood Glucose: No results found for: GLUCAP   Pulmonary Assessment Scores:  Pulmonary Assessment Scores    Row Name 08/02/20 1638         ADL UCSD   ADL Phase Entry     SOB Score total 52           CAT Score   CAT Score 20           mMRC Score   mMRC Score 3           UCSD: Self-administered rating of dyspnea associated with activities of daily living (ADLs) 6-point scale (0 = "not at all" to 5 = "maximal or unable to do because of breathlessness")  Scoring Scores range from 0  to 120.  Minimally important difference is 5 units  CAT: CAT can identify the health impairment of COPD patients and is better correlated with disease progression.  CAT has a scoring range of zero to 40. The CAT score is classified into four groups of low (less than 10), medium (10 - 20), high (21-30) and very high (31-40) based on the impact level of disease on health status. A CAT score over 10 suggests significant symptoms.  A worsening CAT score could be explained by an exacerbation, poor medication adherence, poor inhaler technique, or progression of COPD or comorbid conditions.  CAT MCID is 2 points  mMRC: mMRC (Modified Medical Research Council) Dyspnea Scale is used to assess the degree of baseline functional disability in patients of respiratory disease due to dyspnea. No minimal important difference is established. A decrease in score of 1 point or greater is considered a positive change.   Pulmonary Function Assessment:   Exercise Target Goals: Exercise Program Goal: Individual exercise prescription set using results from initial 6 min walk test and THRR while considering  patients activity barriers and safety.   Exercise Prescription Goal: Initial exercise prescription builds to 30-45 minutes a day of aerobic activity, 2-3 days  per week.  Home exercise guidelines will be given to patient during program as part of exercise prescription that the participant will acknowledge.  Activity Barriers & Risk Stratification:  Activity Barriers & Cardiac Risk Stratification - 08/02/20 1413      Activity Barriers & Cardiac Risk Stratification   Activity Barriers Shortness of Breath;Back Problems;Balance Concerns;Deconditioning    Cardiac Risk Stratification Moderate           6 Minute Walk:  6 Minute Walk    Row Name 08/02/20 1632         6 Minute Walk   Phase Initial     Distance 838 feet     Walk Time 6 minutes     # of Rest Breaks 0     MPH 1.59     METS 2.35     RPE  13     Perceived Dyspnea  3     VO2 Peak 8.24     Symptoms No     Resting HR 78 bpm     Resting BP 118/80     Resting Oxygen Saturation  98 %     Exercise Oxygen Saturation  during 6 min walk 87 %     Max Ex. HR 114 bpm     Max Ex. BP 132/80     2 Minute Post BP 110/70           Interval HR   1 Minute HR 82     2 Minute HR 95     3 Minute HR 94     4 Minute HR 92     5 Minute HR 108     6 Minute HR 114     2 Minute Post HR 75     Interval Heart Rate? Yes           Interval Oxygen   Interval Oxygen? Yes     Baseline Oxygen Saturation % 98 %     1 Minute Oxygen Saturation % 96 %     1 Minute Liters of Oxygen 3 L     2 Minute Oxygen Saturation % 90 %     2 Minute Liters of Oxygen 3 L     3 Minute Oxygen Saturation % 89 %     3 Minute Liters of Oxygen 3 L     4 Minute Oxygen Saturation % 87 %     4 Minute Liters of Oxygen 4 L     5 Minute Oxygen Saturation % 93 %     5 Minute Liters of Oxygen 4 L     6 Minute Oxygen Saturation % 92 %     6 Minute Liters of Oxygen 4 L     2 Minute Post Oxygen Saturation % 100 %     2 Minute Post Liters of Oxygen 3 L            Oxygen Initial Assessment:  Oxygen Initial Assessment - 08/02/20 1636      Initial 6 min Walk   Oxygen Used Continuous    Liters per minute 3      Program Oxygen Prescription   Program Oxygen Prescription Continuous      Intervention   Short Term Goals To learn and exhibit compliance with exercise, home and travel O2 prescription;To learn and understand importance of monitoring SPO2 with pulse oximeter and demonstrate accurate use of the pulse oximeter.;To learn and understand importance of maintaining oxygen saturations>88%;To learn and demonstrate  proper pursed lip breathing techniques or other breathing techniques.;To learn and demonstrate proper use of respiratory medications    Long  Term Goals Exhibits compliance with exercise, home and travel O2 prescription;Verbalizes importance of monitoring SPO2  with pulse oximeter and return demonstration;Maintenance of O2 saturations>88%;Exhibits proper breathing techniques, such as pursed lip breathing or other method taught during program session;Compliance with respiratory medication;Demonstrates proper use of MDIs           Oxygen Re-Evaluation:  Oxygen Re-Evaluation    Row Name 08/02/20 1636 08/19/20 0821 09/09/20 0746 10/05/20 0806       Program Oxygen Prescription   Program Oxygen Prescription -- Continuous Continuous Continuous    Liters per minute _0 Home Oxygen   Home Oxygen Device -- Home Concentrator;E-Tanks Home Concentrator;E-Tanks Home Concentrator;E-Tanks    Sleep Oxygen Prescription -- Continuous Continuous Continuous    Liters per minute -- _1 Home Exercise Oxygen Prescription -- Continuous Continuous Continuous    Liters per minute -- _2 Home Resting Oxygen Prescription -- Continuous Continuous Continuous    Liters per minute -- _3 Compliance with Home Oxygen Use -- Yes Yes Yes         Goals/Expected Outcomes   Short Term Goals -- To learn and exhibit compliance with exercise, home and travel O2 prescription;To learn and understand importance of monitoring SPO2 with pulse oximeter and demonstrate accurate use of the pulse oximeter.;To learn and understand importance of maintaining oxygen saturations>88%;To learn and demonstrate proper pursed lip breathing techniques or other breathing techniques.;To learn and demonstrate proper use of respiratory medications To learn and exhibit compliance with exercise, home and travel O2 prescription;To learn and understand importance of monitoring SPO2 with pulse oximeter and demonstrate accurate use of the pulse oximeter.;To learn and understand importance of maintaining oxygen saturations>88%;To learn and demonstrate proper pursed lip breathing techniques or other breathing techniques.;To learn and demonstrate proper use of respiratory medications To  learn and exhibit compliance with exercise, home and travel O2 prescription;To learn and understand importance of monitoring SPO2 with pulse oximeter and demonstrate accurate use of the pulse oximeter.;To learn and understand importance of maintaining oxygen saturations>88%;To learn and demonstrate proper pursed lip breathing techniques or other breathing techniques.;To learn and demonstrate proper use of respiratory medications    Long  Term Goals -- Exhibits compliance with exercise, home and travel O2 prescription;Verbalizes importance of monitoring SPO2 with pulse oximeter and return demonstration;Maintenance of O2 saturations>88%;Exhibits proper breathing techniques, such as pursed lip breathing or other method taught during program session;Compliance with respiratory medication;Demonstrates proper use of MDIs Exhibits compliance with exercise, home and travel O2 prescription;Verbalizes importance of monitoring SPO2 with pulse oximeter and return demonstration;Maintenance of O2 saturations>88%;Exhibits proper breathing techniques, such as pursed lip breathing or other method taught during program session;Compliance with respiratory medication;Demonstrates proper use of MDIs Exhibits compliance with exercise, home and travel O2 prescription;Verbalizes importance of monitoring SPO2 with pulse oximeter and return demonstration;Maintenance of O2 saturations>88%;Exhibits proper breathing techniques, such as pursed lip breathing or other method taught during program session;Compliance with respiratory medication;Demonstrates proper use of MDIs    Goals/Expected Outcomes -- compliance and understanding of oxygen saturation and pursed lip breathing compliance and understanding of oxygen saturation and pursed lip breathing compliance and understanding of oxygen saturation and pursed lip breathing           Oxygen Discharge (  Final Oxygen Re-Evaluation):  Oxygen Re-Evaluation - 10/05/20 0806      Program  Oxygen Prescription   Program Oxygen Prescription Continuous    Liters per minute 3      Home Oxygen   Home Oxygen Device Home Concentrator;E-Tanks    Sleep Oxygen Prescription Continuous    Liters per minute 2    Home Exercise Oxygen Prescription Continuous    Liters per minute 3    Home Resting Oxygen Prescription Continuous    Liters per minute 2    Compliance with Home Oxygen Use Yes      Goals/Expected Outcomes   Short Term Goals To learn and exhibit compliance with exercise, home and travel O2 prescription;To learn and understand importance of monitoring SPO2 with pulse oximeter and demonstrate accurate use of the pulse oximeter.;To learn and understand importance of maintaining oxygen saturations>88%;To learn and demonstrate proper pursed lip breathing techniques or other breathing techniques.;To learn and demonstrate proper use of respiratory medications    Long  Term Goals Exhibits compliance with exercise, home and travel O2 prescription;Verbalizes importance of monitoring SPO2 with pulse oximeter and return demonstration;Maintenance of O2 saturations>88%;Exhibits proper breathing techniques, such as pursed lip breathing or other method taught during program session;Compliance with respiratory medication;Demonstrates proper use of MDIs    Goals/Expected Outcomes compliance and understanding of oxygen saturation and pursed lip breathing           Initial Exercise Prescription:  Initial Exercise Prescription - 08/03/20 0700      Date of Initial Exercise RX and Referring Provider   Date 08/02/20    Referring Provider Dr. Chase Caller    Expected Discharge Date 10/07/20      Oxygen   Oxygen Continuous    Liters 3      Recumbant Bike   Level 1    Minutes 15      NuStep   Level 2    Minutes 15      Prescription Details   Frequency (times per week) 2    Duration Progress to 30 minutes of continuous aerobic without signs/symptoms of physical distress      Intensity    THRR 40-80% of Max Heartrate 58-117    Ratings of Perceived Exertion 11-13    Perceived Dyspnea 0-4      Progression   Progression Continue to progress workloads to maintain intensity without signs/symptoms of physical distress.      Resistance Training   Training Prescription Yes    Weight orange bands    Reps 10-15           Perform Capillary Blood Glucose checks as needed.  Exercise Prescription Changes:  Exercise Prescription Changes    Row Name 08/19/20 0800 08/24/20 1400 09/07/20 1500 09/09/20 1500 09/21/20 1500     Response to Exercise   Blood Pressure (Admit) 112/58 120/64 108/60 -- 104/60   Blood Pressure (Exercise) 100/64 120/64 120/70 -- 140/70   Blood Pressure (Exit) 102/76 102/60 100/70 -- 102/60   Heart Rate (Admit) 84 bpm 70 bpm 80 bpm -- 72 bpm   Heart Rate (Exercise) 118 bpm 94 bpm 98 bpm -- 89 bpm   Heart Rate (Exit) 85 bpm 80 bpm 87 bpm -- 76 bpm   Oxygen Saturation (Admit) 95 % 100 % 98 % -- 100 %   Oxygen Saturation (Exercise) 92 % 98 % 92 % -- 97 %   Oxygen Saturation (Exit) 98 % 97 % 96 % -- 98 %   Rating of Perceived  Exertion (Exercise) _0 -- 13   Perceived Dyspnea (Exercise) _1 -- 2.5   Duration Continue with 30 min of aerobic exercise without signs/symptoms of physical distress. Continue with 30 min of aerobic exercise without signs/symptoms of physical distress. Continue with 45 min of aerobic exercise without signs/symptoms of physical distress. -- Continue with 45 min of aerobic exercise without signs/symptoms of physical distress.   Intensity Other (comment)  40%-80% HR Max THRR unchanged THRR unchanged -- THRR unchanged     Progression   Progression Continue to progress workloads to maintain intensity without signs/symptoms of physical distress. Continue to progress workloads to maintain intensity without signs/symptoms of physical distress. Continue to progress workloads to maintain intensity without signs/symptoms of physical distress.  -- Continue to progress workloads to maintain intensity without signs/symptoms of physical distress.   Average METs 2 -- -- -- --     Horticulturist, commercial Prescription Yes Yes Yes -- Yes   Weight blue bands blue bands blue bands -- blue bands   Reps 10-15 10-15 10-15 -- 10-15   Time 10 Minutes 10 Minutes 10 Minutes -- 10 Minutes     Oxygen   Oxygen Continuous Continuous Continuous -- Continuous   Liters _2 -- 3     NuStep   Level _3 -- 4   SPM 80 80 80 -- 80   Minutes _4 -- 15   METs 1.7 1.8 1.9 -- 1.8     Track   Laps _5 -- --   Minutes _6 -- --   METs 2.28 2.39 -- -- --     Home Exercise Plan   Plans to continue exercise at -- -- -- Home (comment) --   Frequency -- -- -- Add 2 additional days to program exercise sessions. --   Initial Home Exercises Provided -- -- -- 09/09/20 --          Exercise Comments:  Exercise Comments    Row Name 08/10/20 1538 09/09/20 1511         Exercise Comments Pt completed first day of exercise and tolerated well with no complaints. Pt does have balance issues especially when walking. Will continue to monitor and progress as he is able. Completed home exercise with pt. Pt is very rarely doing exercise at home. Gave him suggesstions and ways to progress home exercise. Recommended that he exercise 2 extra days outside of rehab. Pt was receptive.             Exercise Goals and Review:  Exercise Goals    Row Name 08/02/20 1415             Exercise Goals   Increase Physical Activity Yes       Intervention Provide advice, education, support and counseling about physical activity/exercise needs.;Develop an individualized exercise prescription for aerobic and resistive training based on initial evaluation findings, risk stratification, comorbidities and participant's personal goals.       Expected Outcomes Short Term: Attend rehab on a regular basis to increase amount of physical activity.;Long Term:  Exercising regularly at least 3-5 days a week.;Long Term: Add in home exercise to make exercise part of routine and to increase amount of physical activity.       Increase Strength and Stamina Yes       Intervention Provide advice, education, support and counseling about physical activity/exercise needs.;Develop an individualized exercise prescription for aerobic and resistive  training based on initial evaluation findings, risk stratification, comorbidities and participant's personal goals.       Expected Outcomes Short Term: Increase workloads from initial exercise prescription for resistance, speed, and METs.;Short Term: Perform resistance training exercises routinely during rehab and add in resistance training at home;Long Term: Improve cardiorespiratory fitness, muscular endurance and strength as measured by increased METs and functional capacity (6MWT)       Able to understand and use rate of perceived exertion (RPE) scale Yes       Intervention Provide education and explanation on how to use RPE scale       Expected Outcomes Short Term: Able to use RPE daily in rehab to express subjective intensity level;Long Term:  Able to use RPE to guide intensity level when exercising independently       Able to understand and use Dyspnea scale Yes       Intervention Provide education and explanation on how to use Dyspnea scale       Expected Outcomes Short Term: Able to use Dyspnea scale daily in rehab to express subjective sense of shortness of breath during exertion;Long Term: Able to use Dyspnea scale to guide intensity level when exercising independently       Knowledge and understanding of Target Heart Rate Range (THRR) Yes       Intervention Provide education and explanation of THRR including how the numbers were predicted and where they are located for reference       Expected Outcomes Short Term: Able to state/look up THRR;Long Term: Able to use THRR to govern intensity when exercising  independently;Short Term: Able to use daily as guideline for intensity in rehab       Understanding of Exercise Prescription Yes       Intervention Provide education, explanation, and written materials on patient's individual exercise prescription       Expected Outcomes Short Term: Able to explain program exercise prescription;Long Term: Able to explain home exercise prescription to exercise independently              Exercise Goals Re-Evaluation :  Exercise Goals Re-Evaluation    Row Name 08/19/20 0819 09/09/20 0742 10/05/20 0803         Exercise Goal Re-Evaluation   Exercise Goals Review Increase Physical Activity;Increase Strength and Stamina;Able to understand and use rate of perceived exertion (RPE) scale;Able to understand and use Dyspnea scale;Knowledge and understanding of Target Heart Rate Range (THRR);Understanding of Exercise Prescription Increase Physical Activity;Increase Strength and Stamina;Able to understand and use rate of perceived exertion (RPE) scale;Able to understand and use Dyspnea scale;Knowledge and understanding of Target Heart Rate Range (THRR);Understanding of Exercise Prescription Increase Physical Activity;Increase Strength and Stamina;Able to understand and use rate of perceived exertion (RPE) scale;Able to understand and use Dyspnea scale;Knowledge and understanding of Target Heart Rate Range (THRR);Understanding of Exercise Prescription     Comments Pt has completed 3 exercise sessions and has tolerated well so far. Pt does have balance issues. We have him use assistive device while walking to be safe, but he does not use one at home. He is exercising at 1.7 METS on the Nustep and 2.28 METS on the track. Will continue to monitor and progress as able Pt has completed 9 exercise sessions and has been making steady progressions on the Nustep. Pt also walks the track with a wheelchair device because he has poor balance. Overall, he is doing fairly well. He is exercising  at 1.9 METS in the Nustep and  2.28 METS on the track. Will continue to monitor and progress as able. Leiam has completed 14 exercise sessions and has been making steady progression throughout the program. He had a change in medication recently that is causing stomach problems and that has caused a decrease in exercise capacity over the last couple of weeks. He has been doing well consider the medication side effects. He remains independent with all of the exercises. He is exercising at 1.9 METS on the Nustep and 2 METS walking the track. Will continue to monitor and progress as able.     Expected Outcomes Through exercise at rehab and home the patient will decrease shortness of breath with daily activities and feel confident in carrying out an exercise regimn at home. Through exercise at rehab and home the patient will decrease shortness of breath with daily activities and feel confident in carrying out an exercise regimn at home. Through exercise at rehab and home the patient will decrease shortness of breath with daily activities and feel confident in carrying out an exercise regimn at home.            Discharge Exercise Prescription (Final Exercise Prescription Changes):  Exercise Prescription Changes - 09/21/20 1500      Response to Exercise   Blood Pressure (Admit) 104/60    Blood Pressure (Exercise) 140/70    Blood Pressure (Exit) 102/60    Heart Rate (Admit) 72 bpm    Heart Rate (Exercise) 89 bpm    Heart Rate (Exit) 76 bpm    Oxygen Saturation (Admit) 100 %    Oxygen Saturation (Exercise) 97 %    Oxygen Saturation (Exit) 98 %    Rating of Perceived Exertion (Exercise) 13    Perceived Dyspnea (Exercise) 2.5    Duration Continue with 45 min of aerobic exercise without signs/symptoms of physical distress.    Intensity THRR unchanged      Progression   Progression Continue to progress workloads to maintain intensity without signs/symptoms of physical distress.      Resistance Training    Training Prescription Yes    Weight blue bands    Reps 10-15    Time 10 Minutes      Oxygen   Oxygen Continuous    Liters 3      NuStep   Level 4    SPM 80    Minutes 15    METs 1.8           Nutrition:  Target Goals: Understanding of nutrition guidelines, daily intake of sodium <1574m, cholesterol <2024m calories 30% from fat and 7% or less from saturated fats, daily to have 5 or more servings of fruits and vegetables.  Biometrics:  Pre Biometrics - 08/02/20 1630      Pre Biometrics   Grip Strength 33 kg            Nutrition Therapy Plan and Nutrition Goals:  Nutrition Therapy & Goals - 08/19/20 1433      Nutrition Therapy   Diet Generally healthful      Personal Nutrition Goals   Nutrition Goal Pt to build a healthy plate including vegetables, fruits, whole grains, and low-fat dairy products in a heart healthy meal plan.    Personal Goal #2 Pt to maintain his weight      Intervention Plan   Intervention Prescribe, educate and counsel regarding individualized specific dietary modifications aiming towards targeted core components such as weight, hypertension, lipid management, diabetes, heart failure and other comorbidities.  Expected Outcomes Short Term Goal: Understand basic principles of dietary content, such as calories, fat, sodium, cholesterol and nutrients.           Nutrition Assessments:  Nutrition Assessments - 08/19/20 1435      Rate Your Plate Scores   Pre Score 60          MEDIFICTS Score Key:  ?70 Need to make dietary changes   40-70 Heart Healthy Diet  ? 40 Therapeutic Level Cholesterol Diet   Picture Your Plate Scores:  <53 Unhealthy dietary pattern with much room for improvement.  41-50 Dietary pattern unlikely to meet recommendations for good health and room for improvement.  51-60 More healthful dietary pattern, with some room for improvement.   >60 Healthy dietary pattern, although there may be some specific  behaviors that could be improved.    Nutrition Goals Re-Evaluation:  Nutrition Goals Re-Evaluation    [place] Name 08/19/20 1434 09/09/20 0747 09/09/20 0752 10/04/20 1347       Goals   Current Weight 165 lb (74.8 kg) 166 lb 14.2 oz (75.7 kg) -- 165 lb 12.6 oz (75.2 kg)    Nutrition Goal Pt to build a healthy plate including vegetables, fruits, whole grains, and low-fat dairy products in a heart healthy meal plan. Pt to build a healthy plate including vegetables, fruits, whole grains, and low-fat dairy products in a heart healthy meal plan. -- Pt to build a healthy plate including vegetables, fruits, whole grains, and low-fat dairy products in a heart healthy meal plan.    Comment -- Weight has been maintained so far. He continues to make heart healthy choices per conversation with RD. Weight has been maintained so far. He continues to make healthy choices per conversation with RD. Weight has been maintained so far. He continues to make healthy choices per conversation with RD.         Personal Goal #2 Re-Evaluation   Personal Goal #2 Pt to maintain his weight Pt to maintain his weight -- Pt to maintain his weight           Nutrition Goals Discharge (Final Nutrition Goals Re-Evaluation):  Nutrition Goals Re-Evaluation - 10/04/20 1347      Goals   Current Weight 165 lb 12.6 oz (75.2 kg)    Nutrition Goal Pt to build a healthy plate including vegetables, fruits, whole grains, and low-fat dairy products in a heart healthy meal plan.    Comment Weight has been maintained so far. He continues to make healthy choices per conversation with RD.      Personal Goal #2 Re-Evaluation   Personal Goal #2 Pt to maintain his weight           Psychosocial: Target Goals: Acknowledge presence or absence of significant depression and/or stress, maximize coping skills, provide positive support system. Participant is able to verbalize types and ability to use techniques and skills needed for reducing  stress and depression.  Initial Review & Psychosocial Screening:  Initial Psych Review & Screening - 08/02/20 1420      Initial Review   Current issues with None Identified      Family Dynamics   Good Support System? Yes      Barriers   Psychosocial barriers to participate in program There are no identifiable barriers or psychosocial needs.;The patient should benefit from training in stress management and relaxation.      Screening Interventions   Interventions Encouraged to exercise  Quality of Life Scores:  Scores of 19 and below usually indicate a poorer quality of life in these areas.  A difference of  2-3 points is a clinically meaningful difference.  A difference of 2-3 points in the total score of the Quality of Life Index has been associated with significant improvement in overall quality of life, self-image, physical symptoms, and general health in studies assessing change in quality of life.  PHQ-9: Recent Review Flowsheet Data    Depression screen [facility] 2/9 08/02/2020 08/02/2020   Decreased Interest 0 0   Down, Depressed, Hopeless 0 0   PHQ - 2 Score 0 0   Altered sleeping 1 -   Tired, decreased energy 0 -   Change in appetite 0 -   Feeling bad or failure about yourself  0 -   Trouble concentrating 1 -   Moving slowly or fidgety/restless 2 -   Suicidal thoughts 0 -   PHQ-9 Score 4 -   Difficult doing work/chores Not difficult at all -     Interpretation of Total Score  Total Score Depression Severity:  1-4 = Minimal depression, 5-9 = Mild depression, 10-14 = Moderate depression, 15-19 = Moderately severe depression, 20-27 = Severe depression   Psychosocial Evaluation and Intervention:  Psychosocial Evaluation - 08/18/20 1158      Psychosocial Evaluation & Interventions   Interventions Encouraged to exercise with the program and follow exercise prescription    Comments Mathan has a  supportive wife/caregiver strong faith community. Pt has memory  issues and relies upon his wife for assistance.  This assistance that is needed  at times can be a source of discord between the two.    Expected Outcomes Kerman will be accepting of the needed assistance from his wife.           Psychosocial Re-Evaluation:  Psychosocial Re-Evaluation    Row Name 08/18/20 1203 09/13/20 1318 09/30/20 0937         Psychosocial Re-Evaluation   Current issues with None Identified None Identified None Identified     Comments Pt has memory issues and distracts easily No psychosocial concerns identified at this time. No concerns identified     Expected Outcomes Rees will be accepting of the needed assistance from his wife For Marcella to continue to be free of psychosocial concerns while participating in pulmonary rehab. To continue to be free of psychosocial concerns while participating in pulmonary rehab.     Continue Psychosocial Services  Follow up required by staff No Follow up required No Follow up required            Psychosocial Discharge (Final Psychosocial Re-Evaluation):  Psychosocial Re-Evaluation - 09/30/20 0937      Psychosocial Re-Evaluation   Current issues with None Identified    Comments No concerns identified    Expected Outcomes To continue to be free of psychosocial concerns while participating in pulmonary rehab.    Continue Psychosocial Services  No Follow up required           Education: Education Goals: Education classes will be provided on a weekly basis, covering required topics. Participant will state understanding/return demonstration of topics presented.  Learning Barriers/Preferences:  Learning Barriers/Preferences - 08/02/20 1421      Learning Barriers/Preferences   Learning Barriers Hearing;Sight;Inability to learn new things   memory issues   Learning Preferences Verbal Instruction;Written Material;Individual Instruction           Education Topics: Risk Factor Reduction:  -Group instruction that  is supported by  a PowerPoint presentation. Instructor discusses the definition of a risk factor, different risk factors for pulmonary disease, and how the heart and lungs work together.   Flowsheet Row PULMONARY REHAB OTHER RESPIRATORY from 09/30/2020 in [place]  Date 08/19/20  Educator Handout  Instruction Review Code 1- Verbalizes Understanding      Nutrition for Pulmonary Patient:  -Group instruction provided by PowerPoint slides, verbal discussion, and written materials to support subject matter. The instructor gives an explanation and review of healthy diet recommendations, which includes a discussion on weight management, recommendations for fruit and vegetable consumption, as well as protein, fluid, caffeine, fiber, sodium, sugar, and alcohol. Tips for eating when patients are short of breath are discussed.   Pursed Lip Breathing:  -Group instruction that is supported by demonstration and informational handouts. Instructor discusses the benefits of pursed lip and diaphragmatic breathing and detailed demonstration on how to preform both.     Oxygen Safety:  -Group instruction provided by PowerPoint, verbal discussion, and written material to support subject matter. There is an overview of What is Oxygen and Why do we need it.  Instructor also reviews how to create a safe environment for oxygen use, the importance of using oxygen as prescribed, and the risks of noncompliance. There is a brief discussion on traveling with oxygen and resources the patient may utilize.   Oxygen Equipment:  -Group instruction provided by [facility] Staff utilizing handouts, written materials, and equipment demonstrations.   Signs and Symptoms:  -Group instruction provided by written material and verbal discussion to support subject matter. Warning signs and symptoms of infection, stroke, and heart attack are reviewed and when to call the physician/911 reinforced. Tips for preventing  the spread of infection discussed.   Advanced Directives:  -Group instruction provided by verbal instruction and written material to support subject matter. Instructor reviews Advanced Directive laws and proper instruction for filling out document.   Pulmonary Video:  -Group video education that reviews the importance of medication and oxygen compliance, exercise, good nutrition, pulmonary hygiene, and pursed lip and diaphragmatic breathing for the pulmonary patient.   Exercise for the Pulmonary Patient:  -Group instruction that is supported by a PowerPoint presentation. Instructor discusses benefits of exercise, core components of exercise, frequency, duration, and intensity of an exercise routine, importance of utilizing pulse oximetry during exercise, safety while exercising, and options of places to exercise outside of rehab.   Flowsheet Row PULMONARY REHAB OTHER RESPIRATORY from 09/30/2020 in [place]  Date 08/26/20  Educator Handout  Instruction Review Code 1- Verbalizes Understanding      Pulmonary Medications:  -Verbally interactive group education provided by instructor with focus on inhaled medications and proper administration.   Anatomy and Physiology of the Respiratory System and Intimacy:  -Group instruction provided by PowerPoint, verbal discussion, and written material to support subject matter. Instructor reviews respiratory cycle and anatomical components of the respiratory system and their functions. Instructor also reviews differences in obstructive and restrictive respiratory diseases with examples of each. Intimacy, Sex, and Sexuality differences are reviewed with a discussion on how relationships can change when diagnosed with pulmonary disease. Common sexual concerns are reviewed.   MD DAY -A group question and answer session with a medical doctor that allows participants to ask questions that relate to their pulmonary disease  state.   OTHER EDUCATION -Group or individual verbal, written, or video instructions that support the educational goals of the pulmonary  rehab program. [place] OTHER RESPIRATORY from 09/30/2020 in [place]  Date 09/02/20  Educator Ailene Ravel Handout  [Nutrition Label Reading ]  Instruction Review Code 1- Verbalizes Understanding      Holiday Eating Survival Tips:  -Group instruction provided by PowerPoint slides, verbal discussion, and written materials to support subject matter. The instructor gives patients tips, tricks, and techniques to help them not only survive but enjoy the holidays despite the onslaught of food that accompanies the holidays.   Knowledge Questionnaire Score:  Knowledge Questionnaire Score - 08/02/20 1638      Knowledge Questionnaire Score   Pre Score 14/18           Core Components/Risk Factors/Patient Goals at Admission:  Personal Goals and Risk Factors at Admission - 08/02/20 1424      Core Components/Risk Factors/Patient Goals on Admission    Weight Management Weight Maintenance;Yes    Improve shortness of breath with ADL's Yes    Intervention Provide education, individualized exercise plan and daily activity instruction to help decrease symptoms of SOB with activities of daily living.    Expected Outcomes Short Term: Improve cardiorespiratory fitness to achieve a reduction of symptoms when performing ADLs;Long Term: Be able to perform more ADLs without symptoms or delay the onset of symptoms    Lipids Yes    Intervention Provide education and support for participant on nutrition & aerobic/resistive exercise along with prescribed medications to achieve LDL <3m, HDL >439m   statin intolerance   Expected Outcomes Short Term: Participant states understanding of desired cholesterol values and is compliant with medications prescribed. Participant is following exercise prescription and nutrition  guidelines.;Long Term: Cholesterol controlled with medications as prescribed, with individualized exercise RX and with personalized nutrition plan. Value goals: LDL < 7036mHDL > 40 mg.           Core Components/Risk Factors/Patient Goals Review:   Goals and Risk Factor Review    Row Name 08/18/20 1204 09/13/20 1320 09/30/20 0938         Core Components/Risk Factors/Patient Goals Review   Personal Goals Review Weight Management/Obesity;Develop more efficient breathing techniques such as purse lipped breathing and diaphragmatic breathing and practicing self-pacing with activity.;Increase knowledge of respiratory medications and ability to use respiratory devices properly.;Improve shortness of breath with ADL's Weight Management/Obesity;Increase knowledge of respiratory medications and ability to use respiratory devices properly.;Develop more efficient breathing techniques such as purse lipped breathing and diaphragmatic breathing and practicing self-pacing with activity. Increase knowledge of respiratory medications and ability to use respiratory devices properly.;Improve shortness of breath with ADL's;Develop more efficient breathing techniques such as purse lipped breathing and diaphragmatic breathing and practicing self-pacing with activity.     Review GarMendells completed 3 exercise sessions.  Pt is maintaining his weight between 75-76 kg.  GarShavar learing the breathing techniques and demonstrates with verbal cues and reminders - due to memory issues and distractability.  Anticipate pt will continue to show progress with exercise pt averages 10 laps on the track and 2.0 METS on the nustep with level 3. GarTiborntinues to maintain his weight, just started on Ofev 1 week ago, is tolerating it well so far.  He has increased his strength and stamina, he is exercising at level 4 on the nustep and walking 7-11 laps on the track.  He is purse lip breathing independently. Has had nausea due to starting on  esbriet, it seems to be improving.  Exercising on level 4 of  the nustep and walking 10 laps on the track.     Expected Outcomes See Admission Goals For Jehiel to continue to improve his strength, stamina, and shortness of breath as he continues to exercise in pulmonary rehab. To continue to progress.            Core Components/Risk Factors/Patient Goals at Discharge (Final Review):   Goals and Risk Factor Review - 09/30/20 0938      Core Components/Risk Factors/Patient Goals Review   Personal Goals Review Increase knowledge of respiratory medications and ability to use respiratory devices properly.;Improve shortness of breath with ADL's;Develop more efficient breathing techniques such as purse lipped breathing and diaphragmatic breathing and practicing self-pacing with activity.    Review Has had nausea due to starting on esbriet, it seems to be improving.  Exercising on level 4 of the nustep and walking 10 laps on the track.    Expected Outcomes To continue to progress.           ITP Comments:   Comments: ITP REVIEW Pt is making expected progress toward pulmonary rehab goals after completing 14 sessions. Recommend continued exercise, life style modification, education, and utilization of breathing techniques to increase stamina and strength and decrease shortness of breath with exertion.

## 2020-10-05 NOTE — Telephone Encounter (Signed)
Called [facility] and they said that the earliest they'd be able to deliver the medication is Saturday if he calls back today. Patient did not answer calls from [facility] or our pharmacy team to both home and cell numbers.  Left VM on home and mobile numbers with [facility] phone number as patient has to schedule delivery and confirm he'll be home on Saturday, 10/09/20. Also sent MyChart message.  Pharmacy phone number: [phone]  Knox Saliva, PharmD, MPH Clinical Pharmacist (Rheumatology and Pulmonology)

## 2020-10-06 ENCOUNTER — Telehealth (HOSPITAL_COMMUNITY): Payer: Self-pay | Admitting: Internal Medicine

## 2020-10-06 ENCOUNTER — Telehealth (HOSPITAL_COMMUNITY): Payer: Self-pay | Admitting: *Deleted

## 2020-10-06 ENCOUNTER — Telehealth: Payer: Self-pay | Admitting: Internal Medicine

## 2020-10-06 NOTE — Telephone Encounter (Signed)
10/06/2020  Attempted to contact patient.  Left voicemail to contact our office back.  We will leave in triage for 1 additional contact on 10/07/2020 as per protocol.  Wyn Quaker, FNP

## 2020-10-06 NOTE — Telephone Encounter (Signed)
Routing to Triage

## 2020-10-07 ENCOUNTER — Encounter (HOSPITAL_COMMUNITY): Payer: Medicare Other

## 2020-10-08 NOTE — Telephone Encounter (Signed)
Called and spoke with pt's wife Roger Sims letting her know the info stated by MR and also about how pt is to take the [place]. [name] verbalized understanding.  Pt had PFT performed 09/02/20 but no follow up appt has been scheduled. MR's first avail appt is not until February.  MR, please advise on if you want pt to be scheduled for a f/u with an APP and if so, how soon should we get pt scheduled?  Also, Roger Sims stated that she has received her covid test results and they have come back positive. She is trying so hard to not expose pt

## 2020-10-08 NOTE — Telephone Encounter (Signed)
Sorry to hear abou wife getting covid  The wife [name] -needs to be referred to the monoclonal antibody hotline today itself  She needs to go to another part of the house and she should remain Masked inside the house  I would recommend Covid PCR testing for the patient himself on October 10, 2020 or October 11, 2020 -> just to be on the safe side. Please order this  In addition, over weekend or anytime -> If patient gets symptoms he also needs to be referred to the monoclonal antibody hotline but he needs to go to the ER or an urgent care to get tested  Re followup - see me back in feb 2022 with spirometry and dlco - 30 min slot

## 2020-10-08 NOTE — Telephone Encounter (Signed)
Called and spoke with pt letting him know the info stated by MR and he verbalized understanding. Since pt's wife is not a pt at our office,she will need to contact PCP about having them refer her for the antibody infusion. Stated to him that MR said he needs to get tested for covid to be on the safe side and if he does either test positive for covid or develop any symptoms that we could then refer him for the infusion.  Also while speaking with pt, stated that MR wants [place] to schedule f/u with him in February repeating PFT (56mn version) prior to OV and he verbalized understanding. Stated to him after we schedule appts if he needed to reschedule for whatever reason he could call office and we could get that taken care of. appts scheduled for pt. Nothing further needed.

## 2020-10-08 NOTE — Telephone Encounter (Signed)
Called and spoke to pt. Pt states his wife was exposed to covid on 12.10.21 and 2 days ago presented with symptoms. Pt's wife is at an UC getting tested. Pt has been vaccinated and educated on the precautions he and his wife should take to minimize the pt's exposure.   Pt also is questioning how to titrate back on Esbriet. Pt believes he will receive a shipment today or tomorrow. Pt states he could not tolerate 3 tabs TID but was OK on 2 tabs TID.   MR, please advise on pt's titration of Esbriet. Thanks.

## 2020-10-08 NOTE — Telephone Encounter (Signed)
I also got word fromt he support group leader Marlane Mingle that patient Roger Sims is not approved till next year and Josph Macho said that Roger Sims is out of esbriet. Josph Macho was able to give Roger Sims esbriet from another patient in the support group till shipment comes/delivery  Happens  My recommendation is   1 pill tid with food through Oct 24, 2020 (enjoy the [place] and new year) which is 2 weeks -> then 2 tabs tid and stay there without escalation  Ensure followup per recent OV

## 2020-10-11 ENCOUNTER — Telehealth: Payer: Self-pay | Admitting: Internal Medicine

## 2020-10-11 NOTE — Telephone Encounter (Signed)
FYI 

## 2020-10-12 ENCOUNTER — Telehealth (HOSPITAL_COMMUNITY): Payer: Self-pay | Admitting: *Deleted

## 2020-10-12 NOTE — Telephone Encounter (Signed)
Attempted to call pt's wife [name] letting her know that MR said pt should be considered for MAB infusion due to testing positive for Covid and having IPF but unable to reach. Left her a detailed message letting her know that we were going to refer pt to [place] hotline for infusion.

## 2020-10-12 NOTE — Telephone Encounter (Signed)
   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

## 2020-10-12 NOTE — Progress Notes (Signed)
Patient tested covid + and was unable to complete his last pulmonary rehab session which would have been his final 6 minute walk test.  He has been discharged from pulmonary rehab.

## 2020-10-12 NOTE — Telephone Encounter (Signed)
OK. Thanks.

## 2020-10-18 ENCOUNTER — Telehealth: Payer: Self-pay | Admitting: Internal Medicine

## 2020-10-18 NOTE — Telephone Encounter (Signed)
Called and spoke to pt. Pt states he never received a call regarding the MAB infusion. Pt states he feels alright and is doing better. Pt states he is taking care of his wife and doing chores around the house but by the evening (5pm) pt states he is worn out. Advised pt to still rest and not over do it, pt verbalized understanding.   Will forward to MR as FYI.

## 2020-10-20 NOTE — Telephone Encounter (Signed)
Called and spoke with patient, he states that he tested + for Covid >10 days ago.  His wife went to a very large conference with a friend that did not let her know that she had a fever, they both also removed their masks during the conference.  He states that he still feels weak, his voice is hoarse, he has a runny nose, minor cough with grey mucus and sob that is at his baseline.  He also states that his sense of taste is messed up.  He is on his oxygen at 2L and his sats are 96%-97%.  He states he feels he is improving.  He is on his esbriet and his stomach has settled down.  Advised him to not over exert himself, get plenty of rest and drink plenty of fluids.  Also advised to let us know if he develops a fever, worsening sob or drop in sats with oxygen or coughing up any mucus that is yellow or green.  Patient verbalized understanding.  Nothing further needed.  Dr. Chase Caller, this is just an FYI.  Thank you.

## 2020-10-20 NOTE — Telephone Encounter (Signed)
Please find out if a referral to [facility] was done. If not please redo esp if symptom onset <= 7days

## 2020-11-03 ENCOUNTER — Telehealth: Payer: Self-pay | Admitting: Internal Medicine

## 2020-11-03 NOTE — Telephone Encounter (Signed)
MR please advise on refilling this medication for the pt.  I did not want to send something in if this is a research pt.

## 2020-11-09 NOTE — Progress Notes (Signed)
Discharge Progress Report  Patient Details  Name: Roger Sims MRN: [id number] Date of Birth: [birth year] Referring Provider:   April [facility] Walk Test from 08/02/2020 in [place]  Referring Provider Dr. Chase Caller       Number of Visits: 15  Reason for Discharge:  Patient reached a stable level of exercise. Patient independent in their exercise. Patient has met program and personal goals.  Smoking History:  Social History   Tobacco Use  Smoking Status Former Smoker  . Packs/day: 1.50  . Types: Cigarettes  Smokeless Tobacco Former Systems developer  Tobacco Comment   30 years ago    Diagnosis:  IPF (idiopathic pulmonary fibrosis) ([place])  ADL [facility]:  Pulmonary Assessment Scores    Row Name 08/02/20 1638         ADL [facility]   ADL Phase Entry     SOB Score total 52           CAT Score   CAT Score 20           mMRC Score   mMRC Score 3            Initial Exercise Prescription:  Initial Exercise Prescription - 08/03/20 0700      Date of Initial Exercise RX and Referring Provider   Date 08/02/20    Referring Provider Dr. Chase Caller    Expected Discharge Date 10/07/20      Oxygen   Oxygen Continuous    Liters 3      Recumbant Bike   Level 1    Minutes 15      NuStep   Level 2    Minutes 15      Prescription Details   Frequency (times per week) 2    Duration Progress to 30 minutes of continuous aerobic without signs/symptoms of physical distress      Intensity   THRR 40-80% of Max Heartrate 58-117    Ratings of Perceived Exertion 11-13    Perceived Dyspnea 0-4      Progression   Progression Continue to progress workloads to maintain intensity without signs/symptoms of physical distress.      Resistance Training   Training Prescription Yes    Weight orange bands    Reps 10-15           Discharge Exercise Prescription (Final Exercise Prescription Changes):  Exercise Prescription Changes - 10/05/20  1500      Response to Exercise   Blood Pressure (Admit) 120/74    Blood Pressure (Exercise) 122/70    Blood Pressure (Exit) 124/76    Heart Rate (Admit) 85 bpm    Heart Rate (Exercise) 108 bpm    Heart Rate (Exit) 92 bpm    Oxygen Saturation (Admit) 98 %    Oxygen Saturation (Exercise) 92 %    Oxygen Saturation (Exit) 97 %    Rating of Perceived Exertion (Exercise) 13    Perceived Dyspnea (Exercise) 2    Duration Continue with 30 min of aerobic exercise without signs/symptoms of physical distress.    Intensity THRR unchanged      Progression   Progression Continue to progress workloads to maintain intensity without signs/symptoms of physical distress.      Resistance Training   Training Prescription Yes    Weight blue bands    Reps 10-15    Time 10 Minutes      Oxygen   Oxygen Continuous    Liters 3  NuStep   Level 4    SPM 80    Minutes 30    METs 2.2           Functional Capacity:  6 Minute Walk    Row Name 08/02/20 1632         6 Minute Walk   Phase Initial     Distance 838 feet     Walk Time 6 minutes     # of Rest Breaks 0     MPH 1.59     METS 2.35     RPE 13     Perceived Dyspnea  3     VO2 Peak 8.24     Symptoms No     Resting HR 78 bpm     Resting BP 118/80     Resting Oxygen Saturation  98 %     Exercise Oxygen Saturation  during 6 min walk 87 %     Max Ex. HR 114 bpm     Max Ex. BP 132/80     2 Minute Post BP 110/70           Interval HR   1 Minute HR 82     2 Minute HR 95     3 Minute HR 94     4 Minute HR 92     5 Minute HR 108     6 Minute HR 114     2 Minute Post HR 75     Interval Heart Rate? Yes           Interval Oxygen   Interval Oxygen? Yes     Baseline Oxygen Saturation % 98 %     1 Minute Oxygen Saturation % 96 %     1 Minute Liters of Oxygen 3 L     2 Minute Oxygen Saturation % 90 %     2 Minute Liters of Oxygen 3 L     3 Minute Oxygen Saturation % 89 %     3 Minute Liters of Oxygen 3 L     4 Minute  Oxygen Saturation % 87 %     4 Minute Liters of Oxygen 4 L     5 Minute Oxygen Saturation % 93 %     5 Minute Liters of Oxygen 4 L     6 Minute Oxygen Saturation % 92 %     6 Minute Liters of Oxygen 4 L     2 Minute Post Oxygen Saturation % 100 %     2 Minute Post Liters of Oxygen 3 L            Psychological, QOL, Others - Outcomes: PHQ 2/9: Depression screen [facility] 2/9 08/02/2020 08/02/2020  Decreased Interest 0 0  Down, Depressed, Hopeless 0 0  PHQ - 2 Score 0 0  Altered sleeping 1 -  Tired, decreased energy 0 -  Change in appetite 0 -  Feeling bad or failure about yourself  0 -  Trouble concentrating 1 -  Moving slowly or fidgety/restless 2 -  Suicidal thoughts 0 -  PHQ-9 Score 4 -  Difficult doing work/chores Not difficult at all -    Quality of Life:   Personal Goals: Goals established at orientation with interventions provided to work toward goal.  Personal Goals and Risk Factors at Admission - 08/02/20 1424      Core Components/Risk Factors/Patient Goals on Admission    Weight Management Weight Maintenance;Yes    Improve shortness  of breath with ADL's Yes    Intervention Provide education, individualized exercise plan and daily activity instruction to help decrease symptoms of SOB with activities of daily living.    Expected Outcomes Short Term: Improve cardiorespiratory fitness to achieve a reduction of symptoms when performing ADLs;Long Term: Be able to perform more ADLs without symptoms or delay the onset of symptoms    Lipids Yes    Intervention Provide education and support for participant on nutrition & aerobic/resistive exercise along with prescribed medications to achieve LDL <59m, HDL >452m   statin intolerance   Expected Outcomes Short Term: Participant states understanding of desired cholesterol values and is compliant with medications prescribed. Participant is following exercise prescription and nutrition guidelines.;Long Term: Cholesterol controlled  with medications as prescribed, with individualized exercise RX and with personalized nutrition plan. Value goals: LDL < 7026mHDL > 40 mg.            Personal Goals Discharge:  Goals and Risk Factor Review    Row Name 08/18/20 1204 09/13/20 1320 09/30/20 0938         Core Components/Risk Factors/Patient Goals Review   Personal Goals Review Weight Management/Obesity;Develop more efficient breathing techniques such as purse lipped breathing and diaphragmatic breathing and practicing self-pacing with activity.;Increase knowledge of respiratory medications and ability to use respiratory devices properly.;Improve shortness of breath with ADL's Weight Management/Obesity;Increase knowledge of respiratory medications and ability to use respiratory devices properly.;Develop more efficient breathing techniques such as purse lipped breathing and diaphragmatic breathing and practicing self-pacing with activity. Increase knowledge of respiratory medications and ability to use respiratory devices properly.;Improve shortness of breath with ADL's;Develop more efficient breathing techniques such as purse lipped breathing and diaphragmatic breathing and practicing self-pacing with activity.     Review GarEarsels completed 3 exercise sessions.  Pt is maintaining his weight between 75-76 kg.  GarBryne learing the breathing techniques and demonstrates with verbal cues and reminders - due to memory issues and distractability.  Anticipate pt will continue to show progress with exercise pt averages 10 laps on the track and 2.0 METS on the nustep with level 3. GarJamariusntinues to maintain his weight, just started on Ofev 1 week ago, is tolerating it well so far.  He has increased his strength and stamina, he is exercising at level 4 on the nustep and walking 7-11 laps on the track.  He is purse lip breathing independently. Has had nausea due to starting on esbriet, it seems to be improving.  Exercising on level 4 of the nustep and  walking 10 laps on the track.     Expected Outcomes See Admission Goals For GarKarlton continue to improve his strength, stamina, and shortness of breath as he continues to exercise in pulmonary rehab. To continue to progress.            Exercise Goals and Review:  Exercise Goals    Row Name 08/02/20 1415             Exercise Goals   Increase Physical Activity Yes       Intervention Provide advice, education, support and counseling about physical activity/exercise needs.;Develop an individualized exercise prescription for aerobic and resistive training based on initial evaluation findings, risk stratification, comorbidities and participant's personal goals.       Expected Outcomes Short Term: Attend rehab on a regular basis to increase amount of physical activity.;Long Term: Exercising regularly at least 3-5 days a week.;Long Term: Add in home exercise to make exercise  part of routine and to increase amount of physical activity.       Increase Strength and Stamina Yes       Intervention Provide advice, education, support and counseling about physical activity/exercise needs.;Develop an individualized exercise prescription for aerobic and resistive training based on initial evaluation findings, risk stratification, comorbidities and participant's personal goals.       Expected Outcomes Short Term: Increase workloads from initial exercise prescription for resistance, speed, and METs.;Short Term: Perform resistance training exercises routinely during rehab and add in resistance training at home;Long Term: Improve cardiorespiratory fitness, muscular endurance and strength as measured by increased METs and functional capacity (6MWT)       Able to understand and use rate of perceived exertion (RPE) scale Yes       Intervention Provide education and explanation on how to use RPE scale       Expected Outcomes Short Term: Able to use RPE daily in rehab to express subjective intensity level;Long Term:   Able to use RPE to guide intensity level when exercising independently       Able to understand and use Dyspnea scale Yes       Intervention Provide education and explanation on how to use Dyspnea scale       Expected Outcomes Short Term: Able to use Dyspnea scale daily in rehab to express subjective sense of shortness of breath during exertion;Long Term: Able to use Dyspnea scale to guide intensity level when exercising independently       Knowledge and understanding of Target Heart Rate Range (THRR) Yes       Intervention Provide education and explanation of THRR including how the numbers were predicted and where they are located for reference       Expected Outcomes Short Term: Able to state/look up THRR;Long Term: Able to use THRR to govern intensity when exercising independently;Short Term: Able to use daily as guideline for intensity in rehab       Understanding of Exercise Prescription Yes       Intervention Provide education, explanation, and written materials on patient's individual exercise prescription       Expected Outcomes Short Term: Able to explain program exercise prescription;Long Term: Able to explain home exercise prescription to exercise independently              Exercise Goals Re-Evaluation:  Exercise Goals Re-Evaluation    Row Name 08/19/20 0819 09/09/20 0742 10/05/20 0803         Exercise Goal Re-Evaluation   Exercise Goals Review Increase Physical Activity;Increase Strength and Stamina;Able to understand and use rate of perceived exertion (RPE) scale;Able to understand and use Dyspnea scale;Knowledge and understanding of Target Heart Rate Range (THRR);Understanding of Exercise Prescription Increase Physical Activity;Increase Strength and Stamina;Able to understand and use rate of perceived exertion (RPE) scale;Able to understand and use Dyspnea scale;Knowledge and understanding of Target Heart Rate Range (THRR);Understanding of Exercise Prescription Increase Physical  Activity;Increase Strength and Stamina;Able to understand and use rate of perceived exertion (RPE) scale;Able to understand and use Dyspnea scale;Knowledge and understanding of Target Heart Rate Range (THRR);Understanding of Exercise Prescription     Comments Pt has completed 3 exercise sessions and has tolerated well so far. Pt does have balance issues. We have him use assistive device while walking to be safe, but he does not use one at home. He is exercising at 1.7 METS on the Nustep and 2.28 METS on the track. Will continue to monitor and progress as able  Pt has completed 9 exercise sessions and has been making steady progressions on the Nustep. Pt also walks the track with a wheelchair device because he has poor balance. Overall, he is doing fairly well. He is exercising at 1.9 METS in the Nustep and 2.28 METS on the track. Will continue to monitor and progress as able. Amore has completed 14 exercise sessions and has been making steady progression throughout the program. He had a change in medication recently that is causing stomach problems and that has caused a decrease in exercise capacity over the last couple of weeks. He has been doing well consider the medication side effects. He remains independent with all of the exercises. He is exercising at 1.9 METS on the Nustep and 2 METS walking the track. Will continue to monitor and progress as able.     Expected Outcomes Through exercise at rehab and home the patient will decrease shortness of breath with daily activities and feel confident in carrying out an exercise regimn at home. Through exercise at rehab and home the patient will decrease shortness of breath with daily activities and feel confident in carrying out an exercise regimn at home. Through exercise at rehab and home the patient will decrease shortness of breath with daily activities and feel confident in carrying out an exercise regimn at home.            Nutrition & Weight - Outcomes:   Pre Biometrics - 08/02/20 1630      Pre Biometrics   Grip Strength 33 kg            Nutrition:  Nutrition Therapy & Goals - 08/19/20 1433      Nutrition Therapy   Diet Generally healthful      Personal Nutrition Goals   Nutrition Goal Pt to build a healthy plate including vegetables, fruits, whole grains, and low-fat dairy products in a heart healthy meal plan.    Personal Goal #2 Pt to maintain his weight      Intervention Plan   Intervention Prescribe, educate and counsel regarding individualized specific dietary modifications aiming towards targeted core components such as weight, hypertension, lipid management, diabetes, heart failure and other comorbidities.    Expected Outcomes Short Term Goal: Understand basic principles of dietary content, such as calories, fat, sodium, cholesterol and nutrients.           Nutrition Discharge:  Nutrition Assessments - 08/19/20 1435      Rate Your Plate Scores   Pre Score 60           Education Questionnaire Score:  Knowledge Questionnaire Score - 08/02/20 1638      Knowledge Questionnaire Score   Pre Score 14/18           Goals reviewed with patient; copy given to patient.

## 2020-11-09 NOTE — Addendum Note (Signed)
Encounter addended by: Lance Morin, RN on: 11/09/2020 9:35 AM  Actions taken: Clinical Note Signed, Episode resolved

## 2020-11-10 NOTE — Telephone Encounter (Signed)
Spoke with the pt   He states he was just needing a refill on his esbriet, not spiriva  He already called pharm and they are shipping meds  He know to contact pharmacy for refills  Nothing further needed

## 2020-11-10 NOTE — Telephone Encounter (Signed)
Ok to send spiriva. He is NOT yet in clinical trial but thanks for thinking ahead

## 2020-12-03 ENCOUNTER — Ambulatory Visit: Payer: Medicare Other | Admitting: Internal Medicine

## 2021-01-11 ENCOUNTER — Other Ambulatory Visit (HOSPITAL_COMMUNITY)
Admission: RE | Admit: 2021-01-11 | Discharge: 2021-01-11 | Disposition: A | Discharge status: Home / Self Care | Payer: Medicare Other | Source: Ambulatory Visit | Attending: Internal Medicine | Admitting: Internal Medicine

## 2021-01-11 DIAGNOSIS — Z20822 Contact with and (suspected) exposure to covid-19: Secondary | ICD-10-CM | POA: Diagnosis not present

## 2021-01-11 DIAGNOSIS — Z01812 Encounter for preprocedural laboratory examination: Secondary | ICD-10-CM | POA: Insufficient documentation

## 2021-01-11 LAB — SARS CORONAVIRUS 2 (TAT 6-24 HRS): SARS Coronavirus 2: NEGATIVE

## 2021-01-13 ENCOUNTER — Ambulatory Visit (INDEPENDENT_AMBULATORY_CARE_PROVIDER_SITE_OTHER): Payer: Medicare Other | Admitting: Internal Medicine

## 2021-01-13 ENCOUNTER — Encounter: Payer: Self-pay | Admitting: Internal Medicine

## 2021-01-13 ENCOUNTER — Ambulatory Visit: Payer: Medicare Other | Admitting: Internal Medicine

## 2021-01-13 ENCOUNTER — Other Ambulatory Visit: Payer: Self-pay

## 2021-01-13 VITALS — BP 118/64 | HR 58 | Temp 97.6°F | Ht 72.0 in | Wt 161.0 lb

## 2021-01-13 DIAGNOSIS — J9611 Chronic respiratory failure with hypoxia: Secondary | ICD-10-CM

## 2021-01-13 DIAGNOSIS — R634 Abnormal weight loss: Secondary | ICD-10-CM

## 2021-01-13 DIAGNOSIS — J84112 Idiopathic pulmonary fibrosis: Secondary | ICD-10-CM | POA: Diagnosis not present

## 2021-01-13 LAB — HEPATIC FUNCTION PANEL
ALT: 16 U/L (ref 0–53)
AST: 20 U/L (ref 0–37)
Albumin: 4.3 g/dL (ref 3.5–5.2)
Alkaline Phosphatase: 65 U/L (ref 39–117)
Bilirubin, Direct: 0.2 mg/dL (ref 0.0–0.3)
Total Bilirubin: 0.9 mg/dL (ref 0.2–1.2)
Total Protein: 6.9 g/dL (ref 6.0–8.3)

## 2021-01-13 LAB — PULMONARY FUNCTION TEST
DL/VA % pred: 64 %
DL/VA: 2.53 ml/min/mmHg/L
DLCO cor % pred: 42 %
DLCO cor: 11.48 ml/min/mmHg
DLCO unc % pred: 42 %
DLCO unc: 11.48 ml/min/mmHg
FEF 25-75 Pre: 4.69 L/sec
FEF2575-%Pred-Pre: 190 %
FEV1-%Pred-Pre: 86 %
FEV1-Pre: 2.92 L
FEV1FVC-%Pred-Pre: 118 %
FEV6-%Pred-Pre: 77 %
FEV6-Pre: 3.38 L
FEV6FVC-%Pred-Pre: 106 %
FVC-%Pred-Pre: 73 %
FVC-Pre: 3.38 L
Pre FEV1/FVC ratio: 86 %
Pre FEV6/FVC Ratio: 100 %

## 2021-01-13 NOTE — Progress Notes (Addendum)
OV 07/08/2020  Subjective:  Patient ID: Roger Sims, male , DOB: 03-05-1946 , age 75 y.o. , MRN: [id number] , ADDRESS: [address] [place] [address]   07/08/2020 -   Chief Complaint  Patient presents with  . Follow-up    pt is here for 2nd opinion losing weight.pt has pain left side .pt has issue with lungs.pt has extreme sob before 0xygen     HPI Roger Sims 75 y.o. -presents to th [facility].  History is provided by him and his wife and review of the chart.  1 year ago he started losing weight.  Since then he is lost 20+ pounds.  Some 8 months ago started getting worsening chronic cough.  He is known to have chronic cough and sinus issues.  In 6 months ago worsening shortness of breath with insidious onset.  Then in June 2021/July 2021 he started having right-sided infra axillary pain.  At that time gallbladder issues was investigated a chest x-ray resulted in a CT scan of the chest which showed ILD.  He was empirically commenced on oxygen.  They feel is progressively getting worse.  Wife also noticed memory issues the last 1 year.  After starting the oxygen his memory issues started getting better but now is getting worse again.  After the CT scan of the chest was done ILD was diagnosed.  He was then asked to go see pulmonary specialist at [facility].  However his neighbor Shaune Leeks -suffers from IPF.  He is part of the support group.  He sees Dr. Dorothyann Peng at [facility].  This patient is now on hospice care and recommended patient see me in the for the patient is here at the [facility] health ILD center.   [place] Integrated Comprehensive ILD Questionnaire  Symptoms:  As abiove and below.  He also has right-sided chest pain in the infra axillary region it is sharp.  Is present in the mornings.  It is there with movement and drinking water.  Slowly getting better since his original abrupt onset.  SYMPTOM SCALE - ILD 07/08/2020   O2 use RA  Shortness of Breath 0 -> 5  scale with 5 being worst (score 6 If unable to do)  At rest 1  Simple tasks - showers, clothes change, eating, shaving 2.5  Household (dishes, doing bed, laundry) NA  Shopping 2.5  Walking level at own pace 2.5  Walking up Stairs 3  Total (30-36) Dyspnea Score 11  How bad is your cough? 2.5  How bad is your fatigue 3  How bad is nausea 0  How bad is vomiting?  0  How bad is diarrhea? 0  How bad is anxiety? 2  How bad is depression 2.5      Past Medical History : Positive history for COPD this was diagnosed in the CT scan of the chest.  I reviewed the medical records.  There is mention.  Is not on any treatment for this.  He reported positive for mild kidney disease for the last few years.  He had a heart attack in 2011 and has a coronary artery stent.  Otherwise negative for collagen vascular disease or tuberculosis or vasculitis.   ROS: Positive for fatigue.  Positive for sicca symptoms.  Positive for 20 pound weight loss in the last several months.  Positive for excessive tiredness   FAMILY HISTORY of LUNG DISEASE: Completely denies   EXPOSURE HISTORY: Smokes cigarettes between 1962  and 1982.  20 cigarettes/day.  Currently not a smoker.  Does not smoke cigars.  No smoking pipes.  No passive smoking.  No vaping.  No marijuana use no cocaine use no intravenous drug use.   HOME and HOBBY DETAILS : Currently lives in a townhouse for the last 7 years.  Age of the townhome for 7 years.  Negative history for any organic antigen exposure in the house.  No dampness.  No mildew or shower curtain.  No humidifier.  No CPAP use no nebulizer use.  No steam iron use.  No Jacuzzi use.  No misting Fountain.  No pet birds or parakeets.  No pet gerbils.  No mold in the [facility] duct.  No music habits.  No gardening habits.   OCCUPATIONAL HISTORY (122 questions) : Exposure history for organic antigens he worked in [employer] he was in the [place] [employer].  He did airplane work and Furniture conservator/restorer work and.   While in the [employer] was also exposed to gas fumes and chemicals.  Otherwise history is negative.   PULMONARY TOXICITY HISTORY (27 items): Denies.   Pulmonary function test conducted 06/03/2020 [facility] shows minimal obstruction with mild restriction and severe decrease in diffusing capacity. He showed an insignificant response to bronchodilator. His ratio was 73%, FEV1 59%, FVC 60% with DLCO 34%. There appears to be an obstructive and restrictive pattern    CXR 2011  Narrative  Clinical Data: Chest pain and heaviness, right upper extremity  numbness, back pain.    PORTABLE CHEST - 1 VIEW 06/16/2010 1610 hours:    Comparison: None.    Findings: Moderate enlargement of the cardiac silhouette. Thoracic  aorta tortuous. Hilar and mediastinal contours otherwise  unremarkable. Lungs clear. Mild pulmonary venous hypertension  without overt edema. No visible pleural effusions. Prominent  paracardiac fat pad on the left.    IMPRESSION:  Cardiomegaly. No acute cardiopulmonary disease.   Provider: Karl Bales   He had a CT scan of the chest at [facility] health system.  I was able to get our radiologist to visualize the CT scan.  They email me back.  Especially Dr. Madie Reno.  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  There does appear to be a mild craniocaudal gradient.  My impression would be "compatible with UIP" per current ATS guidelines.     Dan  ROS - per HPI   OV 08/05/2020  Subjective:  Patient ID: Roger Sims, male , DOB: Feb 23, 1946 , age 75 y.o. , MRN: [id number] , ADDRESS: [address] [place] [address] PCP Thomes Dinning, MD Patient Care Team: Thomes Dinning, MD as PCP - General (Internal Medicine)  This Provider for this visit: Treatment Team:  Attending Provider: Brand Males, MD    08/05/2020 -   Chief Complaint  Patient presents with  . Follow-up    IPF, no issues     HPI Roger Sims 75 y.o. -returns for interstitial lung disease follow-up.  At the last visit after the left I was able to get Dr. Weber Cooks to review the radiology from [facility].  He is given a categorical diagnose of UIP.  He did  not mention any emphysema.  Patient is here with his wife.  They want to know if this is with emphysema because he pulmonary function test at [facility] showed low FVC.  However patient not find that Spiriva works.  I explained to them that I am unable to determine if there is presence of emphysema because of not able to visualize either the image of the PFT.  She says she will try to get the PFT.  I did advise that in the future we will be getting her own PFTs and CT scans as time goes on and will be able to address the emphysema component.  I did explain to them the current diagnosis IPF.  They do know a patient with IPF.  The patient is entering hospice at this point.  Patient does use oxygen.  Overall he is stable from last time.  Weight is stable.  Per the wife- she  reports on and off memory issues on the patient.  This is despite oxygen use.  When we walked him today at room air at rest he was fine but desaturated to labs.  He uses 3 L.  Of note for the last 10 days or so he is got early morning worsening cough with some green mucus.  No wheezing he has had his Covid vaccine   SYMPTOM SCALE - ILD 07/08/2020  08/05/2020 Last Weight  Most recent update: 08/05/2020  2:41 PM   Weight  78.9 kg (174 lb)             O2 use RA   Shortness of Breath 0 -> 5 scale with 5 being worst (score 6 If unable to  do)   At rest 1 0  Simple tasks - showers, clothes change, eating, shaving 2.5 2.5  Household (dishes, doing bed, laundry) NA 2.5  Shopping 2.5 2.5  Walking level at own pace 2.5 2.5  Walking up Stairs 3 3  Total (30-36) Dyspnea Score 11 13  How bad is your cough? 2.5 3  How bad is your fatigue 3 3  How bad is nausea 0 0  How bad is vomiting?  0 0  How bad is diarrhea? 0 1  How bad is anxiety? 2 2.5  How bad is depression 2.5 0   No flowsheet data found.  Simple office walk 185 feet x  3 laps goal with forehead probe 08/05/2020   O2 used ra  Number laps completed 2 of 3  Comments about pace slow  Resting Pulse Ox/HR 92% and 87/min  Final Pulse Ox/HR 85% and 110/min  Desaturated </= 88% yes  Desaturated <= 3% points yes  Got Tachycardic >/= 90/min yes  Symptoms at end of test Winded and dizzy and weak  Miscellaneous comments x      Results for YARDLEY, LEKAS (MRN [id number]) as of 08/05/2020 14:49  Ref. Range 07/08/2020 11:42 07/08/2020 11:42 07/08/2020 11:44  CK Total Latest Ref Range: 44.0 - 196.0 U/L  52   CK, MB Latest Ref Range: 0 - 5.0 ng/mL  3.0   Aldolase Latest Ref Range: < OR = 8.1 U/L  6.3   Sed Rate Latest Ref Range: 0 - 20 mm/hr 15    Anti Nuclear Antibody (ANA) Latest Ref Range: NEGATIVE   POSITIVE (A)   ANA Pattern 1 Unknown  Cytoplasmic, Rods and Rings (A)   ANA Titer 1 Latest Units: titer Negative 1:40 (H)   Angiotensin-Converting Enzyme Latest Ref Range: 9 -  67 U/L  12   Anti JO-1 Latest Ref Range: 0.0 - 0.9 AI <4.0    Cyclic Citrullin Peptide Ab Latest Units: UNITS  18   dsDNA Ab Latest Ref Range: 0 - 9 IU/mL <1    ENA RNP Ab Latest Ref Range: 0.0 - 0.9 AI 0.2    ENA SSA (RO) Ab Latest Ref Range: 0.0 - 0.9 AI <0.2    ENA SSB (LA) Ab Latest Ref Range: 0.0 - 0.9 AI <0.2    RA Latex Turbid. Latest Ref Range: <14 IU/mL  15 (H)   ENA SM Ab Ser-aCnc Latest Ref Range: <1.0 NEG AI <0.2  <1.0 NEG  Scleroderma (Scl-70) (ENA) Antibody, IgG Latest Ref Range: 0.0  - 0.9 AI <0.2    A.Fumigatus #1 Abs Latest Ref Range: Negative  Negative    Micropolyspora faeni, IgG Latest Ref Range: Negative  Negative    Thermoactinomyces vulgaris, IgG Latest Ref Range: Negative  Negative    A. Pullulans Abs Latest Ref Range: Negative  Negative    Thermoact. Saccharii Latest Ref Range: Negative  Negative    Pigeon Serum Abs Latest Ref Range: Negative  Negative    ANA PATTERN Unknown  Nuclear, Speckled (A)   ANA TITER Latest Units: titer  1:40 (H)     OV 01/13/2021  Subjective:  Patient ID: Roger Sims, male , DOB: [birth year] , age 48 y.o. , MRN: [id number] , ADDRESS: [address] [place] [address] PCP Thomes Dinning, MD Patient Care Team: Thomes Dinning, MD as PCP - General (Internal Medicine)  This Provider for this visit: Treatment Team:  Attending Provider: Brand Males, MD    01/13/2021 -   Chief Complaint  Patient presents with  . Follow-up    PFT performed today. Pt states he has been doing okay since last visit. States his breathing has been about the same.    Follow-up idiopathic pulmonary fibrosis.  Diagnosed September/October 2021.  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 -returns for follow-up.  Last seen in the fall 2021.  After that saw nurse practitioner.  In the interim a few weeks ago has developed new onset of A. fib.  Also had some retinal bleeding or intraocular bleeding according to history but currently doing well.  He is only able to do pirfenidone 2 pills 3 times daily.  2 times when he tried to go up to 3 pills 3 times daily got GI side effects.  But it 2 pills 3 times daily he is tolerating it fine.  He is using 2 L of oxygen at rest 3 L with exertion.  He is doing pulmonary rehabilitation.  He is participating in the support group.  He had pulmonary function test and it  is stable.Marland Kitchen  He did not report any weight loss but review of the records show he is losing weight.  In this visit we discussed the concept of clinical trials.  His wife is interested and he expressed interest.  But they will reflect on this.   1. Scientific Purpose  Clinical research is designed to produce generalizable knowledge and to answer questions about the safety and efficacy of intervention(s) under study in order to determine whether or not they may be useful for the care of future patients.  2. Study Procedures  Participation in a trial may involve procedures or tests, in  addition to the intervention(s) under study, that are intended only or primarily to generate scientific knowledge and that are otherwise not necessary for patient care.   3. Uncertainty  For intervention(s) under study in clinical research, there often is less knowledge and more uncertainty about the risks and benefits to a population of trial participants than there is when a doctor offers a patient standard interventions.   4. Adherence to Protocol  Administration of the intervention(s) under study is typically based on a strict protocol with defined dose, scheduling, and use or avoidance of concurrent medications, compared to administration of standard interventions.  5. Clinician as Investigator  Clinicians who are in health care settings provide treatment; in a clinical trial setting, they are also investigating safety and efficacy of an intervention. In otherwise your doctor or nurse practitioner can be wearing 2 hats - one as care giver another as Company secretary  6. Patient as Visual merchandiser Subject  Patients participating in research trials are research subjects or volunteers. In other words participating in research is 100% voluntary and at one's own free weill. The decision to participate or not participate will NOT affect patient care and the doctor-patient relationship in any way  7.  Financial Conflict of Interest Disclosure  One or more of the investigators of  the research trial might have an investment interest in [employer], [facility] the clinical trials practice through which the study is being conducted. Study sponsor pays the practice for the conduct of the study.      SYMPTOM SCALE - ILD 07/08/2020  08/05/2020 Last Weight  Most recent update: 08/05/2020  2:41 PM    Weight  78.9 kg (174 lb)            01/13/2021   O2 use RA  2L [place] with rest. 162#  Shortness of Breath 0 -> 5 scale with 5 being worst (score 6 If unable to do)    At rest 1 0 0  Simple tasks - showers, clothes change, eating, shaving 2.5 2.5 0  Household (dishes, doing bed, laundry) NA 2.5 2  Shopping 2.5 2.5 2  Walking level at own pace 2.5 2.5 2  Walking up Stairs _0 Total (30-36) Dyspnea Score _1 How bad is your cough? 2._2 How bad is your fatigue _3 How bad is nausea 0 0 1  How bad is vomiting?  0 0 0  How bad is diarrhea? 0 1 0  How bad is anxiety? 2 2.5 0  How bad is depression 2.5 0 0    Simple office walk 185 feet x  3 laps goal with forehead probe 08/05/2020   O2 used ra  Number laps completed 2 of 3  Comments about pace slow  Resting Pulse Ox/HR 92% and 87/min  Final Pulse Ox/HR 85% and 110/min  Desaturated </= 88% yes  Desaturated <= 3% points yes  Got Tachycardic >/= 90/min yes  Symptoms at end of test Winded and dizzy and weak  Miscellaneous comments x      PFT  PFT Results Latest Ref Rng & Units 01/13/2021 09/02/2020  FVC-Pre L 3.38 3.09  FVC-Predicted Pre % 73 66  Pre FEV1/FVC % % 86 85  FEV1-Pre L 2.92 2.62  FEV1-Predicted Pre % 86 78  DLCO uncorrected ml/min/mmHg 11.48 14.08  DLCO UNC% % 42 52  DLCO corrected ml/min/mmHg 11.48 14.08  DLCO COR %Predicted % 42 52  DLVA Predicted %  64 76       has no past medical history on file.   reports that he quit smoking about 40 years ago. His smoking use included cigarettes. He has a 22.50  pack-year smoking history. He has quit using smokeless tobacco.  No past surgical history on file.  Allergies  Allergen Reactions  . Penicillins Swelling  . Tetracyclines & Related Anaphylaxis    Gi upset Gi upset   . Metoprolol Tartrate Rash    itching itching   . Prasugrel Rash    Immunization History  Administered Date(s) Administered  . Influenza Split 08/11/2020  . Influenza, High Dose Seasonal PF 07/28/2014, 07/20/2015, 07/01/2019  . PFIZER Comirnaty(Gray Top)Covid-19 Tri-Sucrose Vaccine 12/13/2019, 01/06/2020, 09/10/2020  . Pneumococcal Conjugate-13 03/12/2015  . Pneumococcal Polysaccharide-23 08/30/2011  . Tdap 02/10/2009  . Zoster 02/15/2012    No family history on file.   Current Outpatient Medications:  .  ELIQUIS 5 MG TABS tablet, Take 5 mg by mouth 2 (two) times daily., Disp: , Rfl:  .  ESBRIET 267 MG TABS, Take 3 tablets (801 mg total) by mouth in the morning, at noon, and at bedtime., Disp: 270 tablet, Rfl: 11 .  latanoprost (XALATAN) 0.005 % ophthalmic solution, , Disp: , Rfl:  .  nebivolol (BYSTOLIC) 10 MG tablet, Take 1 tablet by mouth daily., Disp: , Rfl:  .  rosuvastatin (CRESTOR) 20 MG tablet, Take 1 tablet by mouth daily., Disp: , Rfl:       Objective:   Vitals:   01/13/21 1347  BP: 118/64  Pulse: (!) 58  Temp: 97.6 F (36.4 C)  TempSrc: Temporal  SpO2: 97%  Weight: 161 lb (73 kg)  Height: 6' (1.829 m)    Estimated body mass index is 21.84 kg/m as calculated from the following:   Height as of this encounter: 6' (1.829 m).   Weight as of this encounter: 161 lb (73 kg).  _0 @  Filed Weights   01/13/21 1347  Weight: 161 lb (73 kg)     Physical Exam   General: No distress. Looks well Neuro: Alert and Oriented x 3. GCS 15. Speech normal Psych: Pleasant Resp:  Barrel Chest - no.  Wheeze - no, Crackles - yes , No overt respiratory distress CVS: Normal heart sounds. Murmurs - no. Irregular irregular Ext: Stigmata of  Connective Tissue Disease - no HEENT: Normal upper airway. PEERL +. No post nasal drip        Assessment:       ICD-10-CM   1. IPF (idiopathic pulmonary fibrosis) ([place])  T34.287 Pulmonary function test    Hepatic function panel  2. Weight loss, unintentional  R63.4   3. Chronic respiratory failure with hypoxia (H[facility])  J96.11        Plan:     Patient Instructions  IPF (idiopathic pulmonary fibrosis) ([place] - diagnosd Sept/oct 2021  -Diseases stable [see below full pulmonary function test results] -Glad you are tolerating pirfenidone but only at 2 pills 3 times daily [noted that you not able to tolerate full dose because of GI side effects] -Glad you are part of patient support group -Glad you are doing pulmonary rehabilitation and managing the 2-3 L oxygen there -We discussed the concept of clinical trials as a care option and appreciate you willing to reflect on that  - will send your name to the research group  Plan  -Check liver function test today -Continue pirfenidone at 2 pills 3 times daily -Do spirometry and DLCO in 3  months  Pulmonary emphysema, unspecified emphysema type ([place])   -It is unclear to me if you have emphysema associated or not.  The radiologist did not comment about it the breathing test at [facility] did comment about it.  It seems to Spiriva is not helping.  Regardless it is a fibrosis is a major component  Plan -albuterol as needed   Weight loss, unintentional  - currently 01/13/2021 - 162# (was 174# In oct 2021)  plan -We will keep an eye on this especially because of  Pirfenidone - keep up with nutrition   Followup - spirometry and dlco in 12 weeks -   -12  weeks or sooner if needed for 30 min face to face visit with Dr Gaye Alken    Dr. Brand Males, M.D., F.C.C.P,  Pulmonary and Critical Care Medicine Staff Physician, [place] Director - Interstitial Lung Disease  Program   P[facility] [place] at [place], [place], [address]  Pager: [phone], If no answer or between  15:00h - 7:00h: call [phone] Telephone: [phone]  2:12 PM 01/13/2021

## 2021-01-13 NOTE — Patient Instructions (Addendum)
IPF (idiopathic pulmonary fibrosis) ([place] - diagnosd Sept/oct 2021  -Diseases stable [see below full pulmonary function test results] -Glad you are tolerating pirfenidone but only at 2 pills 3 times daily [noted that you not able to tolerate full dose because of GI side effects] -Glad you are part of patient support group -Glad you are doing pulmonary rehabilitation and managing the 2-3 L oxygen there -We discussed the concept of clinical trials as a care option and appreciate you willing to reflect on that  - will send your name to the research group  Plan  -Check liver function test today -Continue pirfenidone at 2 pills 3 times daily -Do spirometry and DLCO in 3 months  Pulmonary emphysema, unspecified emphysema type ([place])   -It is unclear to me if you have emphysema associated or not.  The radiologist did not comment about it the breathing test at [facility] did comment about it.  It seems to Spiriva is not helping.  Regardless it is a fibrosis is a major component  Plan -albuterol as needed   Weight loss, unintentional  - currently 01/13/2021 - 162# (was 174# In oct 2021)  plan -We will keep an eye on this especially because of  Pirfenidone - keep up with nutrition   Followup - spirometry and dlco in 12 weeks -   -12  weeks or sooner if needed for 30 min face to face visit with Dr Chase Caller

## 2021-01-13 NOTE — Progress Notes (Signed)
Spirometry and Dlco done today.

## 2021-01-13 NOTE — Addendum Note (Signed)
Addended by: Suzzanne Cloud E on: 01/13/2021 02:16 PM   Modules accepted: Orders

## 2021-01-16 NOTE — Progress Notes (Signed)
LFT nromal on esbriet

## 2021-01-17 ENCOUNTER — Telehealth: Payer: Self-pay | Admitting: Internal Medicine

## 2021-01-17 NOTE — Telephone Encounter (Signed)
error 

## 2021-01-21 ENCOUNTER — Telehealth: Payer: Self-pay | Admitting: Internal Medicine

## 2021-01-21 MED ORDER — AZITHROMYCIN 250 MG PO TABS: ORAL_TABLET | ORAL | 0 refills | Status: DC

## 2021-01-21 NOTE — Telephone Encounter (Signed)
Please test for COVID-19 call if your test results are positive.  If your oxygen levels are below 88% may increase her oxygen to 3 L to maintain sats greater than 88%.  If continues to drop will need to call us back for further instructions. May have an early upper respiratory infection.  Salt water gargles.  Mucinex DM twice daily as needed for cough or congestion.  A Z-Pak to have on hold if symptoms worsen with discolored mucus. Z-Pak #1 take as directed no refills sent to the pharmacy please  If symptoms or not improving will need further evaluation.  Please contact office for sooner follow up if symptoms do not improve or worsen or seek emergency care

## 2021-01-21 NOTE — Telephone Encounter (Signed)
Called and spoke with patient. He verbalized understanding. Zpak has been sent into pharmacy for him. Nothing further needed at time of call.

## 2021-01-21 NOTE — Telephone Encounter (Signed)
Primary Pulmonologist: Ramawswamy Last office visit and with whom: 01/13/21 with MR What do we see them for (pulmonary problems): IPF Last OV assessment/plan:  Plan:     Patient Instructions  IPF (idiopathic pulmonary fibrosis) ([place] - diagnosd Sept/oct 2021  -Diseases stable [see below full pulmonary function test results] -Glad you are tolerating pirfenidone but only at 2 pills 3 times daily [noted that you not able to tolerate full dose because of GI side effects] -Glad you are part of patient support group -Glad you are doing pulmonary rehabilitation and managing the 2-3 L oxygen there -We discussed the concept of clinical trials as a care option and appreciate you willing to reflect on that             - will send your name to the research group  Plan  -Check liver function test today -Continue pirfenidone at 2 pills 3 times daily -Do spirometry and DLCO in 3 months  Pulmonary emphysema, unspecified emphysema type ([place])   -It is unclear to me if you have emphysema associated or not.  The radiologist did not comment about it the breathing test at [facility] did comment about it.  It seems to Spiriva is not helping.  Regardless it is a fibrosis is a major component  Plan -albuterol as needed   Weight loss, unintentional  - currently 01/13/2021 - 162# (was 174# In oct 2021)  plan -We will keep an eye on this especially because of  Pirfenidone - keep up with nutrition   Followup - spirometry and dlco in 12 weeks -   -12  weeks or sooner if needed for 30 min face to face visit with Dr Chase Caller  Was appointment offered to patient (explain)?  Pt wants recommendations   Reason for call: Called and spoke with pt who believes he might have caught a cold. Pt denies any complaints of fever as his recent temp was 97.2. pt has not done a covid test recently.  Pt said that he has complaints of a sore throat, fatigue, and increased SOB which began about 2  days ago.  Pt said he also has a mild cough which is probably due to the sore throat and is coughing up some clear phlegm.  Pt said that he has been taking Vitamin C as well as Ibuprofen to see if that would help but other than pt going to sleep, he is still having the symptoms.  Pt said after climbing some stairs, his O2 sats dropped to the 80s on his O2 which he wears between 2-3L. Pt said that once he was back resting, his sats did go back up to the 90s.  Pt wants recommendations to help with his symptoms. Tammy, please advise.   Allergies  Allergen Reactions  . Penicillins Swelling  . Tetracyclines & Related Anaphylaxis    Gi upset Gi upset   . Metoprolol Tartrate Rash    itching itching   . Prasugrel Rash    Immunization History  Administered Date(s) Administered  . Influenza Split 08/11/2020  . Influenza, High Dose Seasonal PF 07/28/2014, 07/20/2015, 07/01/2019  . PFIZER Comirnaty(Gray Top)Covid-19 Tri-Sucrose Vaccine 12/13/2019, 01/06/2020, 09/10/2020  . Pneumococcal Conjugate-13 03/12/2015  . Pneumococcal Polysaccharide-23 08/30/2011  . Tdap 02/10/2009  . Zoster 02/15/2012

## 2021-02-14 DIAGNOSIS — I2723 Pulmonary hypertension due to lung diseases and hypoxia: Secondary | ICD-10-CM | POA: Diagnosis present

## 2021-03-07 ENCOUNTER — Telehealth: Payer: Self-pay | Admitting: Internal Medicine

## 2021-03-07 NOTE — Telephone Encounter (Signed)
Patient is returning phone call. Patient phone number is [phone].

## 2021-03-07 NOTE — Telephone Encounter (Signed)
Left message for patient to call back.

## 2021-03-07 NOTE — Telephone Encounter (Signed)
lmtcb for pt

## 2021-03-07 NOTE — Telephone Encounter (Signed)
Has ov on 5/25 , can evaluate O2 levels and determine if New concentrator is needed. wil not be able to get new concentrator until then   If he is sick and O2 sats on 5l/m are dropping in 70-low 80 needs ov for evaluation sooner .   Marland KitchenPlease contact office for sooner follow up if symptoms do not improve or worsen or seek emergency care

## 2021-03-07 NOTE — Telephone Encounter (Signed)
Primary Pulmonologist: MR Last office visit and with whom: 01/13/21 with MR  What do we see them for (pulmonary problems): IPF Last OV assessment/plan:  Patient Instructions  IPF (idiopathic pulmonary fibrosis) ([place] - diagnosd Sept/oct 2021  -Diseases stable [see below full pulmonary function test results] -Glad you are tolerating pirfenidone but only at 2 pills 3 times daily [noted that you not able to tolerate full dose because of GI side effects] -Glad you are part of patient support group -Glad you are doing pulmonary rehabilitation and managing the 2-3 L oxygen there -We discussed the concept of clinical trials as a care option and appreciate you willing to reflect on that             - will send your name to the research group  Plan  -Check liver function test today -Continue pirfenidone at 2 pills 3 times daily -Do spirometry and DLCO in 3 months  Pulmonary emphysema, unspecified emphysema type ([place])   -It is unclear to me if you have emphysema associated or not.  The radiologist did not comment about it the breathing test at [facility] did comment about it.  It seems to Spiriva is not helping.  Regardless it is a fibrosis is a major component  Plan -albuterol as needed   Weight loss, unintentional  - currently 01/13/2021 - 162# (was 174# In oct 2021)  plan -We will keep an eye on this especially because of  Pirfenidone - keep up with nutrition   Followup - spirometry and dlco in 12 weeks -   -12  weeks or sooner if needed for 30 min face to face visit with Dr Chase Caller                Was appointment offered to patient (explain)?  Patient wanted recommendations first.   Reason for call: Called and spoke with patient. He stated that he has been through a lot in his personal life in the past 2 months since his last OV with MR. At that office visit, he was using 2-3L of O2. He has bumped himself up to 4-5L, especially with exertion. He stated  that when he is stationary, his O2 remains at 92% but he attempts to get up and move around, his pulse ox shows his O2 drops to the higher 70%s. He also mentioned that it is possible that his Afib has been acting up lately. He has an appt with his cardiologist on 5/19. He has called their office (telephone note in [place]) and the soonest they could see him was 5/19.   He wants to know if we could write an order for him to have a concentrator that goes above 5L. DME is [employer].   (examples of things to ask: : When did symptoms start? Fever? Cough? Productive? Color to sputum? More sputum than usual? Wheezing? Have you needed increased oxygen? Are you taking your respiratory medications? What over the counter measures have you tried?)  Allergies  Allergen Reactions  . Penicillins Swelling  . Tetracyclines & Related Anaphylaxis    Gi upset Gi upset   . Metoprolol Tartrate Rash    itching itching   . Prasugrel Rash    Immunization History  Administered Date(s) Administered  . Influenza Split 08/11/2020  . Influenza, High Dose Seasonal PF 07/28/2014, 07/20/2015, 07/01/2019  . PFIZER Comirnaty(Gray Top)Covid-19 Tri-Sucrose Vaccine 12/13/2019, 01/06/2020, 09/10/2020  . Pneumococcal Conjugate-13 03/12/2015  . Pneumococcal Polysaccharide-23 08/30/2011  . Tdap 02/10/2009  . Zoster 02/15/2012  TP, can you please advise since MR is unavailable? Thanks!

## 2021-03-08 NOTE — Telephone Encounter (Signed)
Lmtcb for pt.

## 2021-03-10 ENCOUNTER — Telehealth: Payer: Self-pay | Admitting: Internal Medicine

## 2021-03-10 NOTE — Telephone Encounter (Signed)
Called and left detailed message for pt of the recs per TP. Will close encounter as we have been unsuccessful reaching the pt after several attempts.

## 2021-03-11 ENCOUNTER — Other Ambulatory Visit (HOSPITAL_COMMUNITY)
Admission: RE | Admit: 2021-03-11 | Discharge: 2021-03-11 | Disposition: A | Discharge status: Home / Self Care | Payer: Medicare Other | Source: Ambulatory Visit | Attending: Internal Medicine | Admitting: Internal Medicine

## 2021-03-11 DIAGNOSIS — Z01812 Encounter for preprocedural laboratory examination: Secondary | ICD-10-CM | POA: Diagnosis present

## 2021-03-11 DIAGNOSIS — Z20822 Contact with and (suspected) exposure to covid-19: Secondary | ICD-10-CM | POA: Insufficient documentation

## 2021-03-11 LAB — SARS CORONAVIRUS 2 (TAT 6-24 HRS): SARS Coronavirus 2: NEGATIVE

## 2021-03-11 NOTE — Telephone Encounter (Signed)
duplicate

## 2021-03-16 ENCOUNTER — Encounter: Payer: Self-pay | Admitting: Internal Medicine

## 2021-03-16 ENCOUNTER — Ambulatory Visit: Payer: Medicare Other | Admitting: Internal Medicine

## 2021-03-16 ENCOUNTER — Other Ambulatory Visit: Payer: Self-pay

## 2021-03-16 ENCOUNTER — Other Ambulatory Visit: Payer: Self-pay | Admitting: *Deleted

## 2021-03-16 ENCOUNTER — Ambulatory Visit
Admission: RE | Admit: 2021-03-16 | Discharge: 2021-03-16 | Disposition: A | Discharge status: Home / Self Care | Payer: Self-pay | Source: Ambulatory Visit | Attending: Internal Medicine | Admitting: Internal Medicine

## 2021-03-16 ENCOUNTER — Ambulatory Visit (INDEPENDENT_AMBULATORY_CARE_PROVIDER_SITE_OTHER): Payer: Medicare Other | Admitting: Internal Medicine

## 2021-03-16 ENCOUNTER — Telehealth: Payer: Self-pay | Admitting: Internal Medicine

## 2021-03-16 VITALS — BP 116/64 | HR 84 | Ht 72.0 in | Wt 161.0 lb

## 2021-03-16 DIAGNOSIS — Z7189 Other specified counseling: Secondary | ICD-10-CM | POA: Diagnosis not present

## 2021-03-16 DIAGNOSIS — J84112 Idiopathic pulmonary fibrosis: Secondary | ICD-10-CM

## 2021-03-16 DIAGNOSIS — J9611 Chronic respiratory failure with hypoxia: Secondary | ICD-10-CM

## 2021-03-16 LAB — PULMONARY FUNCTION TEST
DL/VA % pred: 47 %
DL/VA: 1.86 ml/min/mmHg/L
DLCO cor % pred: 24 %
DLCO cor: 6.45 ml/min/mmHg
DLCO unc % pred: 24 %
DLCO unc: 6.45 ml/min/mmHg
FEF 25-75 Pre: 2.66 L/sec
FEF2575-%Pred-Pre: 110 %
FEV1-%Pred-Pre: 64 %
FEV1-Pre: 2.13 L
FEV1FVC-%Pred-Pre: 114 %
FEV6-%Pred-Pre: 58 %
FEV6-Pre: 2.5 L
FEV6FVC-%Pred-Pre: 106 %
FVC-%Pred-Pre: 56 %
FVC-Pre: 2.57 L
Pre FEV1/FVC ratio: 83 %
Pre FEV6/FVC Ratio: 100 %

## 2021-03-16 MED ORDER — ALPRAZOLAM 0.25 MG PO TABS: 0.2500 mg | ORAL_TABLET | Freq: Every evening | ORAL | 5 refills | Status: DC | PRN

## 2021-03-16 NOTE — Telephone Encounter (Signed)
Roger Sims/Roger Sims  Roger Sims has dramatically declined.  He has IPF.  I - Please consider him for right heart catheterization [he has cardiologist at [facility] but for this procedure they wanted to keep it with inside [facility] medical group]

## 2021-03-16 NOTE — Progress Notes (Signed)
OV 07/08/2020  Subjective:  Patient ID: Roger Sims, male , DOB: September 07, 1946 , age 75 y.o. , MRN: [id number] , ADDRESS: [address] [place] [address]   07/08/2020 -   Chief Complaint  Patient presents with  . Follow-up    pt is here for 2nd opinion losing weight.pt has pain left side .pt has issue with lungs.pt has extreme sob before 0xygen     HPI Roger Sims 75 y.o. -p[facility].  History is provided by him and his wife and review of the chart.  1 year ago he started losing weight.  Since then he is lost 20+ pounds.  Some 8 months ago started getting worsening chronic cough.  He is known to have chronic cough and sinus issues.  In 6 months ago worsening shortness of breath with insidious onset.  Then in June 2021/July 2021 he started having right-sided infra axillary pain.  At that time gallbladder issues was investigated a chest x-ray resulted in a CT scan of the chest which showed ILD.  He was empirically commenced on oxygen.  They feel is progressively getting worse.  Wife also noticed memory issues the last 1 year.  After starting the oxygen his memory issues started getting better but now is getting worse again.  After the CT scan of the chest was done ILD was diagnosed.  He was then asked to go see pulmonary specialist at [facility].  However his neighbor Shaune Leeks -suffers from IPF.  He is part of the support group.  He sees Dr. Dorothyann Peng at [facility].  This patient is now on hospice care and recommended patient see me in the for the patient is here at the [facility] health ILD center.   [place] Integrated Comprehensive ILD Questionnaire  Symptoms:  As abiove and below.  He also has right-sided chest pain in the infra axillary region it is sharp.  Is present in the mornings.  It is there with movement and drinking water.  Slowly getting better since his original abrupt onset.  SYMPTOM SCALE - ILD 07/08/2020   O2 use RA  Shortness of Breath 0 -> 5  scale with 5 being worst (score 6 If unable to do)  At rest 1  Simple tasks - showers, clothes change, eating, shaving 2.5  Household (dishes, doing bed, laundry) NA  Shopping 2.5  Walking level at own pace 2.5  Walking up Stairs 3  Total (30-36) Dyspnea Score 11  How bad is your cough? 2.5  How bad is your fatigue 3  How bad is nausea 0  How bad is vomiting?  0  How bad is diarrhea? 0  How bad is anxiety? 2  How bad is depression 2.5      Past Medical History : Positive history for COPD this was diagnosed in the CT scan of the chest.  I reviewed the medical records.  There is mention.  Is not on any treatment for this.  He reported positive for mild kidney disease for the last few years.  He had a heart attack in 2011 and has a coronary artery stent.  Otherwise negative for collagen vascular disease or tuberculosis or vasculitis.   ROS: Positive for fatigue.  Positive for sicca symptoms.  Positive for 20 pound weight loss in the last several months.  Positive for excessive tiredness   FAMILY HISTORY of LUNG DISEASE: Completely denies   EXPOSURE HISTORY: Smokes cigarettes between 1962 and  1982.  20 cigarettes/day.  Currently not a smoker.  Does not smoke cigars.  No smoking pipes.  No passive smoking.  No vaping.  No marijuana use no cocaine use no intravenous drug use.   HOME and HOBBY DETAILS : Currently lives in a townhouse for the last 7 years.  Age of the townhome for 7 years.  Negative history for any organic antigen exposure in the house.  No dampness.  No mildew or shower curtain.  No humidifier.  No CPAP use no nebulizer use.  No steam iron use.  No Jacuzzi use.  No misting Fountain.  No pet birds or parakeets.  No pet gerbils.  No mold in the [facility] duct.  No music habits.  No gardening habits.   OCCUPATIONAL HISTORY (122 questions) : Exposure history for organic antigens he worked in [employer] he was in the [place] War.  He did airplane work and Furniture conservator/restorer work and.   While in the [place] was also exposed to gas fumes and chemicals.  Otherwise history is negative.   PULMONARY TOXICITY HISTORY (27 items): Denies.   Pulmonary function test conducted 06/03/2020 [facility] shows minimal obstruction with mild restriction and severe decrease in diffusing capacity. He showed an insignificant response to bronchodilator. His ratio was 73%, FEV1 59%, FVC 60% with DLCO 34%. There appears to be an obstructive and restrictive pattern    CXR 2011  Narrative  Clinical Data: Chest pain and heaviness, right upper extremity  numbness, back pain.    PORTABLE CHEST - 1 VIEW 06/16/2010 1610 hours:    Comparison: None.    Findings: Moderate enlargement of the cardiac silhouette. Thoracic  aorta tortuous. Hilar and mediastinal contours otherwise  unremarkable. Lungs clear. Mild pulmonary venous hypertension  without overt edema. No visible pleural effusions. Prominent  paracardiac fat pad on the left.    IMPRESSION:  Cardiomegaly. No acute cardiopulmonary disease.   Provider: Karl Bales   He had a CT scan of the chest at [facility] health system.  I was able to get our radiologist to visualize the CT scan.  They email me back.  Especially Dr. Madie Reno.  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  There does appear to be a mild craniocaudal gradient.  My impression would be "compatible with UIP" per current ATS guidelines.     Dan  ROS - per HPI   OV 08/05/2020  Subjective:  Patient ID: Roger Sims, male , DOB: [birth year] , age 75 y.o. , MRN: [id number] , ADDRESS: [address] [place] [address] PCP Thomes Dinning, MD Patient Care Team: Thomes Dinning, MD as PCP - General (Internal Medicine)  This Provider for this visit: Treatment Team:  Attending Provider: Brand Males, MD    08/05/2020 -   Chief Complaint  Patient presents with  . Follow-up    IPF, no issues     HPI Roger Sims 75 y.o. -returns for interstitial lung disease follow-up.  At the last visit after the left I was able to get Dr. Weber Cooks to review the radiology from [facility].  He is given a categorical diagnose of UIP.  He did not  mention any emphysema.  Patient is here with his wife.  They want to know if this is with emphysema because he pulmonary function test at [facility] showed low FVC.  However patient not find that [facility] works.  I explained to them that I am unable to determine if there is presence of emphysema because of not able to visualize either the image of the PFT.  She says she will try to get the PFT.  I did advise that in the future we will be getting her own PFTs and CT scans as time goes on and will be able to address the emphysema component.  I did explain to them the current diagnosis IPF.  They do know a patient with IPF.  The patient is entering hospice at this point.  Patient does use oxygen.  Overall he is stable from last time.  Weight is stable.  Per the wife- she  reports on and off memory issues on the patient.  This is despite oxygen use.  When we walked him today at room air at rest he was fine but desaturated to labs.  He uses 3 L.  Of note for the last 10 days or so he is got early morning worsening cough with some green mucus.  No wheezing he has had his Covid vaccine  Results for ANTWUAN, ECKLEY (MRN [id number]) as of 08/05/2020 14:49  Ref. Range 07/08/2020 11:42 07/08/2020 11:42 07/08/2020 11:44  CK Total Latest Ref Range: 44.0 - 196.0 U/L  52   CK, MB Latest Ref Range: 0 - 5.0 ng/mL  3.0    Aldolase Latest Ref Range: < OR = 8.1 U/L  6.3   Sed Rate Latest Ref Range: 0 - 20 mm/hr 15    Anti Nuclear Antibody (ANA) Latest Ref Range: NEGATIVE   POSITIVE (A)   ANA Pattern 1 Unknown  Cytoplasmic, Rods and Rings (A)   ANA Titer 1 Latest Units: titer Negative 1:40 (H)   Angiotensin-Converting Enzyme Latest Ref Range: 9 - 67 U/L  12   Anti JO-1 Latest Ref Range: 0.0 - 0.9 AI <5.3    Cyclic Citrullin Peptide Ab Latest Units: UNITS  18   dsDNA Ab Latest Ref Range: 0 - 9 IU/mL <1    ENA RNP Ab Latest Ref Range: 0.0 - 0.9 AI 0.2    ENA SSA (RO) Ab Latest Ref Range: 0.0 - 0.9 AI <0.2    ENA SSB (LA) Ab Latest Ref Range: 0.0 - 0.9 AI <0.2    RA Latex Turbid. Latest Ref Range: <14 IU/mL  15 (H)   ENA SM Ab Ser-aCnc Latest Ref Range: <1.0 NEG AI <0.2  <1.0 NEG  Scleroderma (Scl-70) (ENA) Antibody, IgG Latest Ref Range: 0.0 - 0.9 AI <0.2    A.Fumigatus #1 Abs Latest Ref Range: Negative  Negative    Micropolyspora faeni, IgG Latest Ref Range: Negative  Negative    Thermoactinomyces vulgaris, IgG Latest Ref Range: Negative  Negative    A. Pullulans Abs Latest Ref Range: Negative  Negative    Thermoact. Saccharii Latest Ref Range: Negative  Negative    Pigeon Serum Abs Latest Ref Range: Negative  Negative    ANA PATTERN Unknown  Nuclear, Speckled (A)   ANA TITER Latest Units: titer  1:40 (H)     OV 01/13/2021  Subjective:  Patient ID: Roger Sims, male , DOB: May 01, 1946 , age 15 y.o. , MRN: [id number] , ADDRESS: [address]  [place] [address] PCP Thomes Dinning, MD Patient Care Team: Thomes Dinning, MD as PCP - General (Internal Medicine)  This Provider for this visit: Treatment Team:  Attending Provider: Brand Males, MD    01/13/2021 -   Chief Complaint  Patient presents with  . Follow-up    PFT performed today. Pt states he has been doing okay since last visit. States his breathing has been about the same.    Follow-up idiopathic pulmonary  fibrosis.  Diagnosed September/October 2021.  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 -returns for follow-up.  Last seen in the fall 2021.  After that saw nurse practitioner.  In the interim a few weeks ago has developed new onset of A. fib.  Also had some retinal bleeding or intraocular bleeding according to history but currently doing well.  He is only able to do pirfenidone 2 pills 3 times daily.  2 times when he tried to go up to 3 pills 3 times daily got GI side effects.  But it 2 pills 3 times daily he is tolerating it fine.  He is using 2 L of oxygen at rest 3 L with exertion.  He is doing pulmonary rehabilitation.  He is participating in the support group.  He had pulmonary function test and it is stable.Marland Kitchen  He did not report any weight loss but review of the records show he is losing weight.  In this visit we discussed the concept of clinical trials.  His wife is interested and he expressed interest.  But they will reflect on this.  PFT  PFT Results Latest Ref Rng & Units 01/13/2021 09/02/2020  FVC-Pre L 3.38 3.09  FVC-Predicted Pre % 73 66  Pre FEV1/FVC % % 86 85  FEV1-Pre L 2.92 2.62  FEV1-Predicted Pre % 86 78  DLCO uncorrected ml/min/mmHg 11.48 14.08  DLCO UNC% % 42 52  DLCO corrected ml/min/mmHg 11.48 14.08  DLCO COR %Predicted % 42 52  DLVA Predicted % 64 76    OV 03/16/2021  Subjective:  Patient ID: Roger Sims, male , DOB: 10/19/1946 , age 71 y.o. , MRN: [id number] , ADDRESS: [address] [place] [address] PCP Thomes Dinning, MD Patient Care Team: Thomes Dinning, MD as PCP - General (Internal Medicine)  This Provider for this visit: Treatment Team:  Attending Provider: Brand Males, MD   Follow-up idiopathic pulmonary fibrosis.  Diagnosed September/October 2021.  On pirfenidone Prior  smoker Unintentional weight loss Chronic hypoxemic respiratory failure on 2 L oxygen History of COVID-19 early 2022/late 2021  03/16/2021 -   Chief Complaint  Patient presents with  . Follow-up    PFT performed today.  Pt states his breathing has become a lot worse since last visit. Now requiring 4-5L at rest and if ambulates on 5L, sats are dropping down to 70s. Pt did receive a regulator from DME to see if can get concentrator up to 8L but is having problems with concentrator beeping if tries to get it higher than 5L. Pt also has a lot of coughing that is worse in the mornings.     Follows up with his wife IPF.  Since that time I last saw him he is dramatically declined.  4 weeks ago he stopped his pirfenidone because of intolerance of GI side effects and weight loss.  He and his wife state they do not  want to take this medicine ever again.  He also does not want to go on nintedanib because of fear of side effects.  The content doing supportive care at this point.  He is dramatically declined.  His lung function shows a significant drop [see below].  His oxygen needs are worse.  Wife states that when he walks room to room he is needing 5 to 6 L and he still desaturates into the 70s.  Here 89% room air at rest when absolutely still.  His mother-in-law died last week from old age of 12.  The funeral is coming up.  His wife and he have decided to move to a 1 bedroom condo on a flat surface to improve the quality of life.  Wife is in tears given his decline.  She is asking for help.  She is appears to be in spiritual distress particular because her mother also died.  At this point in time they are seeking directionality and care.     SYMPTOM SCALE - ILD 07/08/2020  08/05/2020 Last Weight  Most recent update: 08/05/2020  2:41 PM    Weight  78.9 kg (174 lb)            01/13/2021  03/16/2021   O2 use RA  2L [place] with rest. 162# 161#  Shortness of Breath 0 -> 5 scale with 5 being worst (score 6 If  unable to do)     At rest 1 0 0 3/5  Simple tasks - showers, clothes change, eating, shaving 2.5 2.5 0 4  Household (dishes, doing bed, laundry) NA 2._0 Shopping 2.5 2._1 Walking level at own pace 2.5 2._2 Walking up Stairs _3 Total (30-36) Dyspnea Score _4 How bad is your cough? 2._5 How bad is your fatigue _6 How bad is nausea 0 0 1 2.5  How bad is vomiting?  0 0 0 0  How bad is diarrhea? 0 1 0 0  How bad is anxiety? 2 2.5 0 3.5  How bad is depression 2.5 0 0 4    Simple office walk 185 feet x  3 laps goal with forehead probe 08/05/2020  03/16/2021   O2 used ra ra at rest - still 89%, did a walk test with 6 L  Number laps completed 2 of 3   Comments about pace slow   Resting Pulse Ox/HR 92% and 87/min  100% on 2 L at rest with heart rate 91  Final Pulse Ox/HR 85% and 110/min  95% on 6 L with heart rate 98  Desaturated </= 88% yes  yes  Desaturated <= 3% points yes  yes  Got Tachycardic >/= 90/min yes  yes  Symptoms at end of test Winded and dizzy and weak   Miscellaneous comments x  worse.  Needed 6 L to correct hypoxic on room air at rest.        PFT  PFT Results Latest Ref Rng & Units 03/16/2021 01/13/2021 09/02/2020  FVC-Pre L 2.57 3.38 3.09  FVC-Predicted Pre % 56 73 66  Pre FEV1/FVC % % 83 86 85  FEV1-Pre L 2.13 2.92 2.62  FEV1-Predicted Pre % 64 86 78  DLCO uncorrected ml/min/mmHg 6.45 11.48 14.08  DLCO UNC% % 24 42 52  DLCO corrected ml/min/mmHg 6.45 11.48 14.08  DLCO COR %Predicted % 24 42 52  DLVA Predicted %  47 64 76       has no past medical history on file.   reports that he quit smoking about 40 years ago. His smoking use included cigarettes. He has a 22.50 pack-year smoking history. He has quit using smokeless tobacco.  No past surgical history on file.  Allergies  Allergen Reactions  . Penicillins Swelling  . Tetracyclines & Related Anaphylaxis    Gi upset Gi upset   . Metoprolol Tartrate Rash     itching itching   . Prasugrel Rash    Immunization History  Administered Date(s) Administered  . Influenza Split 08/11/2020  . Influenza, High Dose Seasonal PF 07/28/2014, 07/20/2015, 07/01/2019  . PFIZER Comirnaty(Gray Top)Covid-19 Tri-Sucrose Vaccine 12/13/2019, 01/06/2020, 09/10/2020  . Pneumococcal Conjugate-13 03/12/2015  . Pneumococcal Polysaccharide-23 08/30/2011  . Tdap 02/10/2009  . Zoster 02/15/2012    No family history on file.   Current Outpatient Medications:  .  ELIQUIS 5 MG TABS tablet, Take 5 mg by mouth 2 (two) times daily., Disp: , Rfl:  .  furosemide (LASIX) 20 MG tablet, Take 1 tablet by mouth daily as needed for fluid or edema., Disp: , Rfl:  .  latanoprost (XALATAN) 0.005 % ophthalmic solution, , Disp: , Rfl:  .  nebivolol (BYSTOLIC) 10 MG tablet, Take 1 tablet by mouth daily., Disp: , Rfl:  .  rosuvastatin (CRESTOR) 20 MG tablet, Take 1 tablet by mouth daily., Disp: , Rfl:       Objective:   Vitals:   03/16/21 1403  BP: 116/64  Pulse: 84  SpO2: 98%  Weight: 161 lb (73 kg)  Height: 6' (1.829 m)    Estimated body mass index is 21.84 kg/m as calculated from the following:   Height as of this encounter: 6' (1.829 m).   Weight as of this encounter: 161 lb (73 kg).  _0 @  Filed Weights   03/16/21 1403  Weight: 161 lb (73 kg)     Physical Exam Frail elderly male sitting in the wheelchair.  Has crackles.  Has a hoarse voice.  Looks to be in no distress.  Oxygen on.  Alert and oriented x3.      Assessment:       ICD-10-CM   1. IPF (idiopathic pulmonary fibrosis) (HCC)  J84.112 CT OUTSIDE FILMS CHEST  2. Chronic respiratory failure with hypoxia (HCC)  J96.11   3. Goals of care, counseling/discussion  Z71.89     Significant decline.  Very poor prognosis.  High risk for mortality in the next 1 year.  Discussed medicare hospice benefit  - a medicare paid benefit  - for people with terminal qualifying  illness such as IPF, COPD,  cancer with statistical prognosis < 6 months for which there is no cure - utilization of hospice shows people live longer paradoxically than those without hospice due to improved attention  - explained hospice locations - home, residential etc.,   - explained respite care options for caregivers  - explained that she could still get treatment for non-hospice diagnosis and still come to office to see me for hospice related diagnosis that i provide support for  - explained that hospice provides nursing, MD, chaplain, volunteer and medications and supplies paid through medicare   They seem interested in home palliation versus hospice.  But at the same time because of the current move she is not ready to make the commitment to put him in hospice at home.  He is also open to the idea about inhaled treprostinil.  They want to explore this.  Agreed with them that currently antifibrotic's might be too high risk and symptoms of side effect profile and affecting quality of life.  Focus is quality of life.  We can try to see if he will benefit from inhaled treprostinil but he needs a right heart cath first.  He has an outside cardiologist.  But they have agreed and they want to actually keep the cardiology care at least for the right heart catheterization here.  Will refer to Dr. Loralie Champagne of [facility].  It is possible that he declines rapidly because he seems to be.  Explained to them that 1 year prognosis looks very bad  We will arrange for quick follow-up on the phone.  Once his mother-in-law funeral is over and he is relocated to a new home may be there in a better frame of mind to discuss hospice versus home palliatio versus even considering clinical trials as a care option which she was previously interested in.  Currently the main focus is to improve the quality of life.  If he stabilizes with or without inhaled treprostinil then we could consider clinical trials as a care option along with home  palliative care.  If he continues to decline, then home hospice.      Plan:     Patient Instructions  IPF (idiopathic pulmonary fibrosis) ([place] - diagnosd Sept/oct 2021  - progressive disease - major dcline in lung function since march -> may 2022; from 73% to 56%  - correlates with increased o2 need  - bad prognosis - intoleratnt to esbriet  - respect desire not to ofev  Plan  -supportive care  - DME company to increase o2 to condenser tank  - refer Dr Loralie Champagne or [facility] for right heart cath and tyvaso consideration  - think about home hospice v palliation  - we discusssed this in details  - you agreed to reflect on this (holding off now due to home relocation)  Pulmonary emphysema, unspecified emphysema type ([place])   -It is unclear to me if you have emphysema associated or not.  The radiologist did not comment about it the breathing test at [facility] did comment about it.  It seems to [employer] is not helping.  Regardless it is a fibrosis is a major component  Plan -albuterol as needed   Weight loss, unintentional  -  01/13/2021 - 162# (was 174# In oct 2021) - 03/16/2021 -161#  - bad prognostic sign  plan -monitoir  Followup - 4 weeks iwht MD or APP telephone visit to see course   - 8 weeks with Dr Chase Caller for face to face 30 min   - if unable then can be phone visit    Currently the main focus is to improve the quality of life.  If he stabilizes with or without inhaled treprostinil then we could consider clinical trials as a care option along with home palliative care.  If he continues to decline, then home hospice.   ( Level 05 visit: Estb 40-54 min   in  visit type: on-site physical face to visit  in total care time and counseling or/and coordination of care by this undersigned MD - Dr Brand Males. This includes one or more of the following on this same day 03/16/2021: pre-charting, chart review, note writing, documentation discussion of  test results, diagnostic or treatment recommendations, prognosis, risks and benefits of management options, instructions, education, compliance or risk-factor reduction. It excludes time spent  by the [place] or office staff in the care of the patient. Actual time 55 min) Has dramatically declined.  He has IPF.    SIGNATURE    Dr. Brand Males, M.D., F.C.C.P,  Pulmonary and Critical Care Medicine Staff Physician, [place] Director - Interstitial Lung Disease  Program  [facility] at [place], [place], [address]  Pager: [phone], If no answer or between  15:00h - 7:00h: call [phone] Telephone: [phone]  2:43 PM 03/16/2021

## 2021-03-16 NOTE — Addendum Note (Signed)
Addended by: Lorretta Harp on: 03/16/2021 03:53 PM   Modules accepted: Orders

## 2021-03-16 NOTE — Patient Instructions (Addendum)
IPF (idiopathic pulmonary fibrosis) ([place] - diagnosd Sept/oct 2021  - progressive disease - major dcline in lung function since march -> may 2022; from 73% to 56%  - correlates with increased o2 need  - bad prognosis - intoleratnt to esbriet  - respect desire not to ofev  Plan  -supportive care  - DME company to increase o2 to condenser tank  - refer Dr Loralie Champagne or [facility] for right heart cath and tyvaso consideration  - think about home hospice v palliation  - we discusssed this in details  - you agreed to reflect on this (holding off now due to home relocation)  Pulmonary emphysema, unspecified emphysema type ([place])   -It is unclear to me if you have emphysema associated or not.  The radiologist did not comment about it the breathing test at [facility] did comment about it.  It seems to [facility] is not helping.  Regardless it is a fibrosis is a major component  Plan -albuterol as needed   Weight loss, unintentional  -  01/13/2021 - 162# (was 174# In oct 2021) - 03/16/2021 -161#  - bad prognostic sign  plan -monitoir  Followup - 4 weeks iwht MD or APP telephone visit to see course   - 8 weeks with Dr Chase Caller for face to face 30 min   - if unable then can be phone visit

## 2021-03-16 NOTE — Telephone Encounter (Signed)
Attempting to contact for availability [phone] no answer [phone] spoke with patient, reports this patient will need to be scheduled around wife's availability. We return call later in the day as requested

## 2021-03-16 NOTE — Progress Notes (Signed)
Spirometry and dlco done today. 

## 2021-03-17 NOTE — Telephone Encounter (Signed)
Spoke with patient again, he would still need to speak with wife to coordinate time for procedures. Phone number to office with instructions to triage given to patient

## 2021-03-21 ENCOUNTER — Inpatient Hospital Stay (HOSPITAL_COMMUNITY)
Admission: EM | Admit: 2021-03-21 | Discharge: 2021-03-27 | DRG: 196 | Disposition: A | Discharge status: Hospice | Payer: Medicare Other | Attending: Internal Medicine | Admitting: Internal Medicine

## 2021-03-21 ENCOUNTER — Other Ambulatory Visit: Payer: Self-pay

## 2021-03-21 ENCOUNTER — Emergency Department (HOSPITAL_COMMUNITY): Payer: Medicare Other

## 2021-03-21 ENCOUNTER — Telehealth: Payer: Self-pay | Admitting: Pulmonary Disease

## 2021-03-21 DIAGNOSIS — I2510 Atherosclerotic heart disease of native coronary artery without angina pectoris: Secondary | ICD-10-CM | POA: Diagnosis present

## 2021-03-21 DIAGNOSIS — Z880 Allergy status to penicillin: Secondary | ICD-10-CM

## 2021-03-21 DIAGNOSIS — I2723 Pulmonary hypertension due to lung diseases and hypoxia: Secondary | ICD-10-CM | POA: Diagnosis present

## 2021-03-21 DIAGNOSIS — Z6822 Body mass index (BMI) 22.0-22.9, adult: Secondary | ICD-10-CM

## 2021-03-21 DIAGNOSIS — Z808 Family history of malignant neoplasm of other organs or systems: Secondary | ICD-10-CM

## 2021-03-21 DIAGNOSIS — Z636 Dependent relative needing care at home: Secondary | ICD-10-CM

## 2021-03-21 DIAGNOSIS — Z7189 Other specified counseling: Secondary | ICD-10-CM

## 2021-03-21 DIAGNOSIS — Z807 Family history of other malignant neoplasms of lymphoid, hematopoietic and related tissues: Secondary | ICD-10-CM

## 2021-03-21 DIAGNOSIS — R54 Age-related physical debility: Secondary | ICD-10-CM | POA: Diagnosis present

## 2021-03-21 DIAGNOSIS — R634 Abnormal weight loss: Secondary | ICD-10-CM | POA: Diagnosis present

## 2021-03-21 DIAGNOSIS — Z66 Do not resuscitate: Secondary | ICD-10-CM | POA: Diagnosis present

## 2021-03-21 DIAGNOSIS — J439 Emphysema, unspecified: Secondary | ICD-10-CM | POA: Diagnosis present

## 2021-03-21 DIAGNOSIS — R0602 Shortness of breath: Secondary | ICD-10-CM | POA: Diagnosis present

## 2021-03-21 DIAGNOSIS — Z20822 Contact with and (suspected) exposure to covid-19: Secondary | ICD-10-CM | POA: Diagnosis present

## 2021-03-21 DIAGNOSIS — J84112 Idiopathic pulmonary fibrosis: Principal | ICD-10-CM | POA: Diagnosis present

## 2021-03-21 DIAGNOSIS — Z79899 Other long term (current) drug therapy: Secondary | ICD-10-CM

## 2021-03-21 DIAGNOSIS — I4892 Unspecified atrial flutter: Secondary | ICD-10-CM | POA: Diagnosis present

## 2021-03-21 DIAGNOSIS — Z955 Presence of coronary angioplasty implant and graft: Secondary | ICD-10-CM

## 2021-03-21 DIAGNOSIS — Z888 Allergy status to other drugs, medicaments and biological substances status: Secondary | ICD-10-CM

## 2021-03-21 DIAGNOSIS — F410 Panic disorder [episodic paroxysmal anxiety] without agoraphobia: Secondary | ICD-10-CM | POA: Diagnosis not present

## 2021-03-21 DIAGNOSIS — J9611 Chronic respiratory failure with hypoxia: Secondary | ICD-10-CM | POA: Diagnosis present

## 2021-03-21 DIAGNOSIS — I5031 Acute diastolic (congestive) heart failure: Secondary | ICD-10-CM

## 2021-03-21 DIAGNOSIS — Z87891 Personal history of nicotine dependence: Secondary | ICD-10-CM

## 2021-03-21 DIAGNOSIS — I2721 Secondary pulmonary arterial hypertension: Secondary | ICD-10-CM | POA: Diagnosis present

## 2021-03-21 DIAGNOSIS — I252 Old myocardial infarction: Secondary | ICD-10-CM

## 2021-03-21 DIAGNOSIS — I4819 Other persistent atrial fibrillation: Secondary | ICD-10-CM | POA: Diagnosis present

## 2021-03-21 DIAGNOSIS — I878 Other specified disorders of veins: Secondary | ICD-10-CM | POA: Diagnosis present

## 2021-03-21 DIAGNOSIS — I110 Hypertensive heart disease with heart failure: Secondary | ICD-10-CM | POA: Diagnosis present

## 2021-03-21 DIAGNOSIS — J189 Pneumonia, unspecified organism: Secondary | ICD-10-CM

## 2021-03-21 DIAGNOSIS — E46 Unspecified protein-calorie malnutrition: Secondary | ICD-10-CM | POA: Diagnosis present

## 2021-03-21 DIAGNOSIS — J9621 Acute and chronic respiratory failure with hypoxia: Secondary | ICD-10-CM | POA: Diagnosis present

## 2021-03-21 DIAGNOSIS — I4891 Unspecified atrial fibrillation: Secondary | ICD-10-CM | POA: Diagnosis present

## 2021-03-21 DIAGNOSIS — Z8249 Family history of ischemic heart disease and other diseases of the circulatory system: Secondary | ICD-10-CM

## 2021-03-21 DIAGNOSIS — E785 Hyperlipidemia, unspecified: Secondary | ICD-10-CM | POA: Diagnosis present

## 2021-03-21 DIAGNOSIS — Z7901 Long term (current) use of anticoagulants: Secondary | ICD-10-CM

## 2021-03-21 DIAGNOSIS — I071 Rheumatic tricuspid insufficiency: Secondary | ICD-10-CM | POA: Diagnosis present

## 2021-03-21 DIAGNOSIS — Z515 Encounter for palliative care: Secondary | ICD-10-CM

## 2021-03-21 DIAGNOSIS — F419 Anxiety disorder, unspecified: Secondary | ICD-10-CM | POA: Diagnosis present

## 2021-03-21 HISTORY — DX: Idiopathic pulmonary fibrosis: J84.112

## 2021-03-21 HISTORY — DX: Atherosclerotic heart disease of native coronary artery without angina pectoris: I25.10

## 2021-03-21 HISTORY — DX: Essential (primary) hypertension: I10

## 2021-03-21 HISTORY — DX: Unspecified atrial fibrillation: I48.91

## 2021-03-21 LAB — CBC WITH DIFFERENTIAL/PLATELET
Abs Immature Granulocytes: 0.08 10*3/uL — ABNORMAL HIGH (ref 0.00–0.07)
Basophils Absolute: 0 10*3/uL (ref 0.0–0.1)
Basophils Relative: 0 %
Eosinophils Absolute: 0.1 10*3/uL (ref 0.0–0.5)
Eosinophils Relative: 1 %
HCT: 49.4 % (ref 39.0–52.0)
Hemoglobin: 16.4 g/dL (ref 13.0–17.0)
Immature Granulocytes: 1 %
Lymphocytes Relative: 5 %
Lymphs Abs: 0.8 10*3/uL (ref 0.7–4.0)
MCH: 31.4 pg (ref 26.0–34.0)
MCHC: 33.2 g/dL (ref 30.0–36.0)
MCV: 94.5 fL (ref 80.0–100.0)
Monocytes Absolute: 1 10*3/uL (ref 0.1–1.0)
Monocytes Relative: 7 %
Neutro Abs: 12.4 10*3/uL — ABNORMAL HIGH (ref 1.7–7.7)
Neutrophils Relative %: 86 %
Platelets: 129 10*3/uL — ABNORMAL LOW (ref 150–400)
RBC: 5.23 MIL/uL (ref 4.22–5.81)
RDW: 15 % (ref 11.5–15.5)
WBC: 14.3 10*3/uL — ABNORMAL HIGH (ref 4.0–10.5)
nRBC: 0 % (ref 0.0–0.2)

## 2021-03-21 LAB — I-STAT ARTERIAL BLOOD GAS, ED
Acid-Base Excess: 1 mmol/L (ref 0.0–2.0)
Bicarbonate: 23.8 mmol/L (ref 20.0–28.0)
Calcium, Ion: 1.21 mmol/L (ref 1.15–1.40)
HCT: 49 % (ref 39.0–52.0)
Hemoglobin: 16.7 g/dL (ref 13.0–17.0)
O2 Saturation: 98 %
Patient temperature: 97.7
Potassium: 3.6 mmol/L (ref 3.5–5.1)
Sodium: 142 mmol/L (ref 135–145)
TCO2: 25 mmol/L (ref 22–32)
pCO2 arterial: 31.5 mmHg — ABNORMAL LOW (ref 32.0–48.0)
pH, Arterial: 7.484 — ABNORMAL HIGH (ref 7.350–7.450)
pO2, Arterial: 91 mmHg (ref 83.0–108.0)

## 2021-03-21 LAB — HEPATIC FUNCTION PANEL
ALT: 28 U/L (ref 0–44)
AST: 68 U/L — ABNORMAL HIGH (ref 15–41)
Albumin: 2.9 g/dL — ABNORMAL LOW (ref 3.5–5.0)
Alkaline Phosphatase: 70 U/L (ref 38–126)
Bilirubin, Direct: 1 mg/dL — ABNORMAL HIGH (ref 0.0–0.2)
Indirect Bilirubin: 2.7 mg/dL — ABNORMAL HIGH (ref 0.3–0.9)
Total Bilirubin: 3.7 mg/dL — ABNORMAL HIGH (ref 0.3–1.2)
Total Protein: 5.6 g/dL — ABNORMAL LOW (ref 6.5–8.1)

## 2021-03-21 LAB — BASIC METABOLIC PANEL
Anion gap: 8 (ref 5–15)
BUN: 22 mg/dL (ref 8–23)
CO2: 24 mmol/L (ref 22–32)
Calcium: 8.6 mg/dL — ABNORMAL LOW (ref 8.9–10.3)
Chloride: 106 mmol/L (ref 98–111)
Creatinine, Ser: 0.96 mg/dL (ref 0.61–1.24)
GFR, Estimated: >60 mL/min (ref >60)
Glucose, Bld: 108 mg/dL — ABNORMAL HIGH (ref 70–99)
Potassium: 5.7 mmol/L — ABNORMAL HIGH (ref 3.5–5.1)
Sodium: 138 mmol/L (ref 135–145)

## 2021-03-21 LAB — TROPONIN I (HIGH SENSITIVITY): Troponin I (High Sensitivity): 29 ng/L — ABNORMAL HIGH (ref <18)

## 2021-03-21 LAB — BRAIN NATRIURETIC PEPTIDE: B Natriuretic Peptide: 613.9 pg/mL — ABNORMAL HIGH (ref 0.0–100.0)

## 2021-03-21 MED ORDER — SODIUM CHLORIDE 0.9 % IV SOLN
2.0000 g | Freq: Once | INTRAVENOUS | Status: AC
  Administered 2021-03-21: 2 g via INTRAVENOUS
  Filled 2021-03-21: qty 2

## 2021-03-21 NOTE — ED Triage Notes (Signed)
Pt BIB EMS from home. Wife reports that pt has been lethargic and complaints of generalized weakness. Over the last week pt has increased exertional SOB. Pt wears 2L [place] at baseline, hx of cystic fibrosis - wife reports she has had to bump pt up to 8L to maintain O2 in the 90s. Pt also has increased bilat pedal edema.  Wife reports today that pt urine was very dark and possibly had blood in it. Pt reports 7/10 pain in central chest.  20LAC  Vitals with EMS  170/106 HR 90  RR 40  96% on 8L [place]  CBG 115 Afebrile  EMS reports that pt is starting hospice care next week - wife is bringing DNR

## 2021-03-21 NOTE — Telephone Encounter (Signed)
Called by Roger Sims's wife who reports that the patient has deteriorated in the last 2 days. Apparently he fell this morning and subsequently had O2 desaturation down to 63%. She ports that she has not been able to stabilize his O2 sats. She reports that there has been some discussion about comfort care/hospice. I can't arrange for hospice comfort care tonight on a long holiday weekend. She is clearly overwhelmed by her husband's current clinical situation. I recommended that the patient be taken to the ED at [facility] or [facility] for admission and arrangements made for comfort care or hospice care at that time.

## 2021-03-21 NOTE — ED Notes (Signed)
Pt to xray

## 2021-03-21 NOTE — ED Provider Notes (Signed)
[place] [facility] EMERGENCY DEPARTMENT Provider Note   CSN: [id number] Arrival date & time: 03/21/21  2025     History Chief Complaint  Patient presents with  . Shortness of Breath  . Weakness  . Fatigue    Roger Sims is a 75 y.o. male.  The history is provided by the patient, medical records and the EMS personnel.  Shortness of Breath Severity:  Severe Onset quality:  Gradual Duration:  1 day Timing:  Constant Progression:  Worsening Chronicity:  New Context: not activity, not animal exposure, not emotional upset, not fumes, not known allergens, not occupational exposure, not pollens, not smoke exposure, not strong odors, not URI and not weather changes   Relieved by:  Oxygen Worsened by:  Activity, deep breathing and exertion Ineffective treatments:  Position changes and rest Associated symptoms: cough and sputum production   Associated symptoms: no abdominal pain, no chest pain, no claudication, no diaphoresis, no ear pain, no fever, no headaches, no hemoptysis, no neck pain, no PND, no rash, no sore throat, no syncope, no swollen glands, no vomiting and no wheezing   Risk factors comment:  History of pulmonary fibrosis, COPD. On 2L [place] at [facility]  Worsening shortness of breath, increased WOB for the last 24-48 hours. On 2-3L [place] at [facility] at rest Wife had to increase to 6L [place] for oxygen saturations of 60% at home.      No past medical history on file.  Patient Active Problem List   Diagnosis Date Noted  . IPF (idiopathic pulmonary fibrosis) ([place]) 09/15/2020  . Pulmonary emphysema ([place]) 09/15/2020  . Chronic respiratory failure with hypoxia ([place]) 09/15/2020  . Weight loss, unintentional 09/15/2020  . Healthcare maintenance 09/15/2020    No past surgical history on file.     No family history on file.  Social History   Tobacco Use  . Smoking status: Former Smoker    Packs/day: 1.50    Years: 15.00    Pack years: 22.50    Types: Cigarettes     Quit date: 1982    Years since quitting: 40.4  . Smokeless tobacco: Former Systems developer  . Tobacco comment: 40 years ago    Home Medications Prior to Admission medications   Medication Sig Start Date End Date Taking? Authorizing Provider  ALPRAZolam (XANAX) 0.25 MG tablet Take 1 tablet (0.25 mg total) by mouth at bedtime as needed for anxiety. 03/16/21   Brand Males, MD  ELIQUIS 5 MG TABS tablet Take 5 mg by mouth 2 (two) times daily. 12/31/20   [provider]  furosemide (LASIX) 20 MG tablet Take 1 tablet by mouth daily as needed for fluid or edema. 03/10/21   [provider]  latanoprost (XALATAN) 0.005 % ophthalmic solution  01/07/17   [provider]  nebivolol (BYSTOLIC) 10 MG tablet Take 1 tablet by mouth daily. 11/27/16   [provider]  rosuvastatin (CRESTOR) 20 MG tablet Take 1 tablet by mouth daily. 01/03/21   [provider]    Allergies    Penicillins, Tetracyclines & related, Metoprolol tartrate, and Prasugrel  Review of Systems   Review of Systems  Constitutional: Positive for activity change and fatigue. Negative for chills, diaphoresis and fever.  HENT: Negative for ear pain and sore throat.   Eyes: Negative for pain and visual disturbance.  Respiratory: Positive for cough, sputum production and shortness of breath. Negative for hemoptysis and wheezing.   Cardiovascular: Negative for chest pain, palpitations, claudication, syncope and PND.  Gastrointestinal: Negative for abdominal pain and vomiting.  Genitourinary: Negative for dysuria and hematuria.  Musculoskeletal: Negative for arthralgias, back pain and neck pain.  Skin: Negative for color change and rash.  Neurological: Negative for seizures, syncope and headaches.  All other systems reviewed and are negative.   Physical Exam Updated Vital Signs BP (!) 170/114   Pulse 88   Temp 97.7 F (36.5 C) (Oral)   Resp (!) 32   SpO2 99%   Physical Exam Vitals and nursing  note reviewed.  Constitutional:      Appearance: He is well-developed. He is ill-appearing. He is not toxic-appearing.     Interventions: Nasal cannula in place.  HENT:     Head: Normocephalic and atraumatic.  Eyes:     Conjunctiva/sclera: Conjunctivae normal.  Cardiovascular:     Rate and Rhythm: Normal rate and regular rhythm.     Pulses:          Radial pulses are 2+ on the right side and 2+ on the left side.       Dorsalis pedis pulses are 2+ on the right side and 2+ on the left side.     Heart sounds: No murmur heard.   Pulmonary:     Effort: Pulmonary effort is normal. Tachypnea present. No accessory muscle usage, respiratory distress or retractions.     Breath sounds: No decreased air movement. Examination of the right-middle field reveals rhonchi. Examination of the left-middle field reveals rhonchi. Rhonchi present. No wheezing.  Abdominal:     Palpations: Abdomen is soft.     Tenderness: There is no abdominal tenderness.  Musculoskeletal:     Cervical back: Neck supple.     Right lower leg: 2+ Edema present.     Left lower leg: 2+ Edema present.  Skin:    General: Skin is warm and dry.  Neurological:     Mental Status: He is alert.     ED Results / Procedures / Treatments   Labs (all labs ordered are listed, but only abnormal results are displayed) Labs Reviewed  BASIC METABOLIC PANEL - Abnormal; Notable for the following components:      Result Value   Potassium 5.7 (*)    Glucose, Bld 108 (*)    Calcium 8.6 (*)    All other components within normal limits  CBC WITH DIFFERENTIAL/PLATELET - Abnormal; Notable for the following components:   WBC 14.3 (*)    Platelets 129 (*)    Neutro Abs 12.4 (*)    Abs Immature Granulocytes 0.08 (*)    All other components within normal limits  HEPATIC FUNCTION PANEL - Abnormal; Notable for the following components:   Total Protein 5.6 (*)    Albumin 2.9 (*)    AST 68 (*)    Total Bilirubin 3.7 (*)    Bilirubin, Direct  1.0 (*)    Indirect Bilirubin 2.7 (*)    All other components within normal limits  I-STAT ARTERIAL BLOOD GAS, ED - Abnormal; Notable for the following components:   pH, Arterial 7.484 (*)    pCO2 arterial 31.5 (*)    All other components within normal limits  BRAIN NATRIURETIC PEPTIDE  URINALYSIS, ROUTINE W REFLEX MICROSCOPIC  TROPONIN I (HIGH SENSITIVITY)    EKG EKG Interpretation  Date/Time:  Monday Mar 21 2021 20:56:41 EDT Ventricular Rate:  89 PR Interval:    QRS Duration: 86 QT Interval:  376 QTC Calculation: 458 R Axis:   98 Text Interpretation: Atrial fibrillation Right  axis deviation Borderline T abnormalities, diffuse leads When compared to prior, now a flutter. NO STEMI Confirmed by Antony Blackbird [phone]) on 03/21/2021 8:57:43 PM   Radiology DG Chest 2 View  Result Date: 03/21/2021 CLINICAL DATA:  Lethargy, generalized weakness, dyspnea on exertion, history of fibrosis EXAM: CHEST - 2 VIEW COMPARISON:  04/21/2020 FINDINGS: Frontal and lateral views of the chest demonstrate mild enlargement of the cardiac silhouette, which may be related to positioning and AP technique. Diffuse fibrotic changes are seen throughout the lungs, with extensive superimposed bilateral ground-glass airspace disease. Trace bilateral effusions. No pneumothorax. IMPRESSION: 1. Background pulmonary fibrosis, with superimposed ground-glass opacities compatible with edema or multifocal pneumonia. 2. Trace bilateral pleural effusions. Electronically Signed   By: Randa Ngo M.D.   On: 03/21/2021 21:29    Procedures Procedures   Medications Ordered in ED Medications  ceFEPIme (MAXIPIME) 2 g in sodium chloride 0.9 % 100 mL IVPB (has no administration in time range)    ED Course  I have reviewed the triage vital signs and the nursing notes.  Pertinent labs & imaging results that were available during my care of the patient were reviewed by me and considered in my medical decision making (see  chart for details).  Clinical Course as of 03/21/21 2215  Mon Mar 21, 2021  2034 Spoke with internal medicine who will see patient for admission [ZB]    Clinical Course User Index [ZB] Pearson Grippe, DO   MDM Rules/Calculators/A&P                          This is a 75yo M with history as above including pulmonary fibrosis and chronic hypoxic respiratory failure on 2L [place] at [facility] who presented to the ED with hypoxia despite home O2. In the ED he was HDS, afebrile. He was requiring 6L [place] to maintain oxygen saturations >89% with rest. Has conversation dyspnea.  Suspect PNA. Also considering CHF, hypervolemia (LE edema). Not septic. Exam not c/w COPD exacerbation. Blood gas reassuring.  CXR with evidence of multi-focal PNA. Could be post-viral however no other risk factors for MRSA. Has an allergy to pcn so after discussion with pharmacy, I ordered for Cefepime.  Will require admission for PNA with new hypoxic respiratory failure.  Also considered ACS. EKG as above without evidence of ischemia. Now in atrial flutter without RVR. Likely in the setting of acute illness. Initial troponin slightly elevated. Will continue to trend. Hospitalist requested COVID/influenza testing which was ordered.  Admitted for further w/u.   Final Clinical Impression(s) / ED Diagnoses Final diagnoses:  None    Rx / DC Orders ED Discharge Orders    None       Pearson Grippe, DO 03/22/21 0023    Tegeler, Gwenyth Allegra, MD 03/24/21 1140

## 2021-03-21 NOTE — ED Notes (Signed)
Lab to add on troponin

## 2021-03-22 ENCOUNTER — Encounter (HOSPITAL_COMMUNITY): Payer: Self-pay | Admitting: Internal Medicine

## 2021-03-22 ENCOUNTER — Inpatient Hospital Stay (HOSPITAL_COMMUNITY): Payer: Medicare Other

## 2021-03-22 DIAGNOSIS — Z66 Do not resuscitate: Secondary | ICD-10-CM | POA: Diagnosis present

## 2021-03-22 DIAGNOSIS — I5031 Acute diastolic (congestive) heart failure: Secondary | ICD-10-CM

## 2021-03-22 DIAGNOSIS — Z636 Dependent relative needing care at home: Secondary | ICD-10-CM | POA: Diagnosis not present

## 2021-03-22 DIAGNOSIS — Z7189 Other specified counseling: Secondary | ICD-10-CM | POA: Diagnosis not present

## 2021-03-22 DIAGNOSIS — J84112 Idiopathic pulmonary fibrosis: Principal | ICD-10-CM

## 2021-03-22 DIAGNOSIS — J9621 Acute and chronic respiratory failure with hypoxia: Secondary | ICD-10-CM

## 2021-03-22 DIAGNOSIS — I482 Chronic atrial fibrillation, unspecified: Secondary | ICD-10-CM

## 2021-03-22 DIAGNOSIS — Z955 Presence of coronary angioplasty implant and graft: Secondary | ICD-10-CM | POA: Diagnosis not present

## 2021-03-22 DIAGNOSIS — Z87891 Personal history of nicotine dependence: Secondary | ICD-10-CM | POA: Diagnosis not present

## 2021-03-22 DIAGNOSIS — E785 Hyperlipidemia, unspecified: Secondary | ICD-10-CM | POA: Diagnosis present

## 2021-03-22 DIAGNOSIS — I5033 Acute on chronic diastolic (congestive) heart failure: Secondary | ICD-10-CM | POA: Diagnosis not present

## 2021-03-22 DIAGNOSIS — I2721 Secondary pulmonary arterial hypertension: Secondary | ICD-10-CM | POA: Diagnosis present

## 2021-03-22 DIAGNOSIS — Z6822 Body mass index (BMI) 22.0-22.9, adult: Secondary | ICD-10-CM | POA: Diagnosis not present

## 2021-03-22 DIAGNOSIS — I252 Old myocardial infarction: Secondary | ICD-10-CM | POA: Diagnosis not present

## 2021-03-22 DIAGNOSIS — I110 Hypertensive heart disease with heart failure: Secondary | ICD-10-CM | POA: Diagnosis present

## 2021-03-22 DIAGNOSIS — Z7901 Long term (current) use of anticoagulants: Secondary | ICD-10-CM | POA: Diagnosis not present

## 2021-03-22 DIAGNOSIS — R634 Abnormal weight loss: Secondary | ICD-10-CM | POA: Diagnosis present

## 2021-03-22 DIAGNOSIS — I4891 Unspecified atrial fibrillation: Secondary | ICD-10-CM | POA: Diagnosis present

## 2021-03-22 DIAGNOSIS — Z808 Family history of malignant neoplasm of other organs or systems: Secondary | ICD-10-CM | POA: Diagnosis not present

## 2021-03-22 DIAGNOSIS — Z888 Allergy status to other drugs, medicaments and biological substances status: Secondary | ICD-10-CM | POA: Diagnosis not present

## 2021-03-22 DIAGNOSIS — I2510 Atherosclerotic heart disease of native coronary artery without angina pectoris: Secondary | ICD-10-CM | POA: Diagnosis present

## 2021-03-22 DIAGNOSIS — I071 Rheumatic tricuspid insufficiency: Secondary | ICD-10-CM | POA: Diagnosis present

## 2021-03-22 DIAGNOSIS — J9611 Chronic respiratory failure with hypoxia: Secondary | ICD-10-CM

## 2021-03-22 DIAGNOSIS — Z515 Encounter for palliative care: Secondary | ICD-10-CM

## 2021-03-22 DIAGNOSIS — J439 Emphysema, unspecified: Secondary | ICD-10-CM | POA: Diagnosis present

## 2021-03-22 DIAGNOSIS — I4892 Unspecified atrial flutter: Secondary | ICD-10-CM | POA: Diagnosis present

## 2021-03-22 DIAGNOSIS — Z20822 Contact with and (suspected) exposure to covid-19: Secondary | ICD-10-CM | POA: Diagnosis present

## 2021-03-22 DIAGNOSIS — E46 Unspecified protein-calorie malnutrition: Secondary | ICD-10-CM | POA: Diagnosis present

## 2021-03-22 DIAGNOSIS — Z880 Allergy status to penicillin: Secondary | ICD-10-CM | POA: Diagnosis not present

## 2021-03-22 DIAGNOSIS — R0602 Shortness of breath: Secondary | ICD-10-CM | POA: Diagnosis present

## 2021-03-22 DIAGNOSIS — I4819 Other persistent atrial fibrillation: Secondary | ICD-10-CM | POA: Diagnosis present

## 2021-03-22 DIAGNOSIS — J189 Pneumonia, unspecified organism: Secondary | ICD-10-CM | POA: Diagnosis not present

## 2021-03-22 DIAGNOSIS — I2723 Pulmonary hypertension due to lung diseases and hypoxia: Secondary | ICD-10-CM | POA: Diagnosis not present

## 2021-03-22 LAB — URINALYSIS, ROUTINE W REFLEX MICROSCOPIC
Bilirubin Urine: NEGATIVE
Glucose, UA: NEGATIVE mg/dL
Hgb urine dipstick: NEGATIVE
Ketones, ur: NEGATIVE mg/dL
Leukocytes,Ua: NEGATIVE
Nitrite: NEGATIVE
Protein, ur: 30 mg/dL — AB
Specific Gravity, Urine: 1.013 (ref 1.005–1.030)
pH: 5 (ref 5.0–8.0)

## 2021-03-22 LAB — PROCALCITONIN: Procalcitonin: <0.1 ng/mL

## 2021-03-22 LAB — BASIC METABOLIC PANEL
Anion gap: 9 (ref 5–15)
BUN: 20 mg/dL (ref 8–23)
CO2: 22 mmol/L (ref 22–32)
Calcium: 8.7 mg/dL — ABNORMAL LOW (ref 8.9–10.3)
Chloride: 109 mmol/L (ref 98–111)
Creatinine, Ser: 0.87 mg/dL (ref 0.61–1.24)
GFR, Estimated: >60 mL/min (ref >60)
Glucose, Bld: 101 mg/dL — ABNORMAL HIGH (ref 70–99)
Potassium: 3.8 mmol/L (ref 3.5–5.1)
Sodium: 140 mmol/L (ref 135–145)

## 2021-03-22 LAB — RESPIRATORY PANEL BY PCR

## 2021-03-22 LAB — RESP PANEL BY RT-PCR (FLU A&B, COVID) ARPGX2
Influenza A by PCR: NEGATIVE
Influenza B by PCR: NEGATIVE
SARS Coronavirus 2 by RT PCR: NEGATIVE

## 2021-03-22 LAB — MRSA PCR SCREENING: MRSA by PCR: NEGATIVE

## 2021-03-22 LAB — CBC
HCT: 49.5 % (ref 39.0–52.0)
Hemoglobin: 16.3 g/dL (ref 13.0–17.0)
MCH: 31.1 pg (ref 26.0–34.0)
MCHC: 32.9 g/dL (ref 30.0–36.0)
MCV: 94.5 fL (ref 80.0–100.0)
Platelets: UNDETERMINED 10*3/uL (ref 150–400)
RBC: 5.24 MIL/uL (ref 4.22–5.81)
RDW: 14.8 % (ref 11.5–15.5)
WBC: 14 10*3/uL — ABNORMAL HIGH (ref 4.0–10.5)
nRBC: 0 % (ref 0.0–0.2)

## 2021-03-22 LAB — TROPONIN I (HIGH SENSITIVITY): Troponin I (High Sensitivity): 31 ng/L — ABNORMAL HIGH (ref <18)

## 2021-03-22 LAB — C-REACTIVE PROTEIN: CRP: 13.7 mg/dL — ABNORMAL HIGH (ref <1.0)

## 2021-03-22 LAB — ECHOCARDIOGRAM COMPLETE: S' Lateral: 3.3 cm

## 2021-03-22 LAB — STREP PNEUMONIAE URINARY ANTIGEN: Strep Pneumo Urinary Antigen: NEGATIVE

## 2021-03-22 MED ORDER — MORPHINE SULFATE (PF) 2 MG/ML IV SOLN: 1.0000 mg | INTRAVENOUS | Status: DC | PRN

## 2021-03-22 MED ORDER — ACETAMINOPHEN 325 MG PO TABS
650.0000 mg | ORAL_TABLET | Freq: Four times a day (QID) | ORAL | Status: DC | PRN
  Administered 2021-03-26: 650 mg via ORAL
  Filled 2021-03-22: qty 2

## 2021-03-22 MED ORDER — METHYLPREDNISOLONE SODIUM SUCC 125 MG IJ SOLR
60.0000 mg | Freq: Two times a day (BID) | INTRAMUSCULAR | Status: DC
  Administered 2021-03-22: 60 mg via INTRAVENOUS
  Filled 2021-03-22: qty 2

## 2021-03-22 MED ORDER — DEXTROSE IN LACTATED RINGERS 5 % IV SOLN: INTRAVENOUS | Status: DC

## 2021-03-22 MED ORDER — SODIUM CHLORIDE 0.9 % IV SOLN
2.0000 g | Freq: Three times a day (TID) | INTRAVENOUS | Status: DC
  Administered 2021-03-22 – 2021-03-23 (×4): 2 g via INTRAVENOUS
  Filled 2021-03-22 (×6): qty 2

## 2021-03-22 MED ORDER — MIDAZOLAM HCL 2 MG/2ML IJ SOLN: 1.0000 mg | INTRAMUSCULAR | Status: DC | PRN

## 2021-03-22 MED ORDER — APIXABAN 5 MG PO TABS
5.0000 mg | ORAL_TABLET | Freq: Two times a day (BID) | ORAL | Status: DC
  Administered 2021-03-22 – 2021-03-26 (×10): 5 mg via ORAL
  Filled 2021-03-22 (×10): qty 1

## 2021-03-22 MED ORDER — ONDANSETRON HCL 4 MG PO TABS: 4.0000 mg | ORAL_TABLET | Freq: Four times a day (QID) | ORAL | Status: DC | PRN

## 2021-03-22 MED ORDER — SODIUM CHLORIDE 0.9 % IV SOLN
500.0000 mg | INTRAVENOUS | Status: DC
  Administered 2021-03-22 – 2021-03-23 (×2): 500 mg via INTRAVENOUS
  Filled 2021-03-22 (×2): qty 500

## 2021-03-22 MED ORDER — ONDANSETRON HCL 4 MG/2ML IJ SOLN: 4.0000 mg | Freq: Four times a day (QID) | INTRAMUSCULAR | Status: DC | PRN

## 2021-03-22 MED ORDER — LATANOPROST 0.005 % OP SOLN
1.0000 [drp] | Freq: Every day | OPHTHALMIC | Status: DC
  Administered 2021-03-22 – 2021-03-26 (×5): 1 [drp] via OPHTHALMIC
  Filled 2021-03-22: qty 2.5

## 2021-03-22 MED ORDER — POTASSIUM CHLORIDE CRYS ER 20 MEQ PO TBCR
40.0000 meq | EXTENDED_RELEASE_TABLET | Freq: Once | ORAL | Status: AC
  Administered 2021-03-22: 40 meq via ORAL
  Filled 2021-03-22: qty 2

## 2021-03-22 MED ORDER — METHYLPREDNISOLONE SODIUM SUCC 125 MG IJ SOLR
125.0000 mg | Freq: Four times a day (QID) | INTRAMUSCULAR | Status: DC
  Administered 2021-03-22 – 2021-03-24 (×8): 125 mg via INTRAVENOUS
  Filled 2021-03-22 (×8): qty 2

## 2021-03-22 MED ORDER — ACETAMINOPHEN 650 MG RE SUPP: 650.0000 mg | Freq: Four times a day (QID) | RECTAL | Status: DC | PRN

## 2021-03-22 MED ORDER — FUROSEMIDE 10 MG/ML IJ SOLN
40.0000 mg | Freq: Once | INTRAMUSCULAR | Status: AC
  Administered 2021-03-22: 40 mg via INTRAVENOUS
  Filled 2021-03-22: qty 4

## 2021-03-22 MED ORDER — FUROSEMIDE 10 MG/ML IJ SOLN
40.0000 mg | Freq: Two times a day (BID) | INTRAMUSCULAR | Status: DC
  Administered 2021-03-22 – 2021-03-24 (×4): 40 mg via INTRAVENOUS
  Filled 2021-03-22 (×4): qty 4

## 2021-03-22 MED ORDER — ROSUVASTATIN CALCIUM 20 MG PO TABS
20.0000 mg | ORAL_TABLET | Freq: Every day | ORAL | Status: DC
  Administered 2021-03-22 – 2021-03-24 (×3): 20 mg via ORAL
  Filled 2021-03-22 (×3): qty 1

## 2021-03-22 MED ORDER — ALPRAZOLAM 0.25 MG PO TABS
0.2500 mg | ORAL_TABLET | Freq: Every day | ORAL | Status: DC | PRN
  Administered 2021-03-22 – 2021-03-26 (×2): 0.25 mg via ORAL
  Filled 2021-03-22 (×2): qty 1

## 2021-03-22 MED ORDER — NEBIVOLOL HCL 5 MG PO TABS
10.0000 mg | ORAL_TABLET | Freq: Every day | ORAL | Status: DC
  Administered 2021-03-22 – 2021-03-26 (×5): 10 mg via ORAL
  Filled 2021-03-22: qty 1
  Filled 2021-03-22 (×4): qty 2

## 2021-03-22 NOTE — ED Notes (Signed)
Family at bedside. 

## 2021-03-22 NOTE — ED Notes (Signed)
Patient repeatedly removing O2 device and briefly desatting. This RN reminding patient to please keep [place] applied so SpO2 does not continually drop. Will continue to monitor.

## 2021-03-22 NOTE — ED Notes (Signed)
Urine culture sent down along with urine specimen

## 2021-03-22 NOTE — H&P (Signed)
History and Physical    Roger Sims NID:[id number] DOB: 06-28-1946 DOA: 03/21/2021  PCP: Thomes Dinning, MD  Patient coming from: Home  I have personally briefly reviewed patient's old medical records in [place]  Chief Complaint: SOB  HPI: Roger Sims is a 75 y.o. male with medical history significant of IPF, CAD s/p stent, HTN, A.fib rate controlled and on eliquis.  Regarding IPF: Pt with 1 year h/o 20 lb wt loss, worsening chronic cough for past 8 months, SOB worsening since 6 months ago.  June/July 2021 pt ultimately diagnosed with IPF with UIP.  Has been having worsening SOB since starting oxygen at that time and increasing O2 requirement.  Pt intolerant to [place], desired not to try Ofev.  Pt with major decline in lung function on PFTs between just March to May of 2022 as documented by Dr. Chase Caller during office visit x5 days ago.  Pt felt to have bad prognosis with respect to to IPF.  Pt presents to the ED with 2 day acute deterioration in ability to ambulate, severe SOB, O2 sats of 63% at home despite 6L O2 (normally 2-3L requirement), AMS.  Symptoms onset suddenly, symptoms are severe, symptoms are worsening.  Due to inability to get up and ambulate, pt hasnt taken home lasix (unable to get to bathroom to urinate).  No CP, no abd pain, no fever.   ED Course: BNP 613.  WBC 14k.  CXR shows B ground glass opacities superimposed on top of his chronic IPF findings.  Radiologist suggests superimposed PNA or pulmonary edema.  Pt given cefepime in ED for possible PNA.  BP running on the high side (160/100 ish).  Has A.Fib in the 90s  No fever.  COVID and flu are negative.  Hospitalist asked to admit.   Review of Systems: As per HPI, otherwise all review of systems negative.  Past Medical History:  Diagnosis Date  . A-fib ([place])   . CAD (coronary artery disease)   . Hypertension   . IPF (idiopathic pulmonary fibrosis) ([place])     Past Surgical  History:  Procedure Laterality Date  . BACK SURGERY    . COLONOSCOPY    . CORONARY ANGIOPLASTY WITH STENT PLACEMENT    . FOOT SURGERY    . RHINOPLASTY    . UVULECTOMY    . VASECTOMY       reports that he quit smoking about 40 years ago. His smoking use included cigarettes. He has a 22.50 pack-year smoking history. He has quit using smokeless tobacco. He reports previous alcohol use. He reports that he does not use drugs.  Allergies  Allergen Reactions  . Penicillins Swelling  . Tetracyclines & Related Anaphylaxis    Gi upset Gi upset   . Metoprolol Tartrate Rash    itching itching   . Prasugrel Rash    Family History  Problem Relation Age of Onset  . Melanoma Mother   . Lymphoma Father   . Heart disease Father      Prior to Admission medications   Medication Sig Start Date End Date Taking? Authorizing Provider  ALPRAZolam (XANAX) 0.25 MG tablet Take 1 tablet (0.25 mg total) by mouth at bedtime as needed for anxiety. Patient taking differently: Take 0.25 mg by mouth daily as needed for anxiety. 03/16/21  Yes Brand Males, MD  ELIQUIS 5 MG TABS tablet Take 5 mg by mouth 2 (two) times daily. 12/31/20  Yes [provider]  furosemide (LASIX) 20 MG tablet Take  20 mg by mouth daily as needed for fluid or edema. 03/10/21  Yes [provider]  guaiFENesin (MUCINEX) 600 MG 12 hr tablet Take 600 mg by mouth 2 (two) times daily as needed for cough.   Yes [provider]  latanoprost (XALATAN) 0.005 % ophthalmic solution Place 1 drop into both eyes at bedtime. 01/07/17  Yes [provider]  nebivolol (BYSTOLIC) 10 MG tablet Take 10 mg by mouth daily. 11/27/16  Yes [provider]  rosuvastatin (CRESTOR) 20 MG tablet Take 20 mg by mouth daily. 01/03/21  Yes [provider]    Physical Exam: Vitals:   03/21/21 2330 03/22/21 0000 03/22/21 0039 03/22/21 0115  BP: (!) 146/109 (!) 157/113  (!) 155/109  Pulse: 97 88 87 86  Resp: (!)  40 (!) 36 (!) 31 (!) 35  Temp:      TempSrc:      SpO2: 92% 92% 91% 99%    Constitutional: NAD, calm, comfortable Eyes: PERRL, lids and conjunctivae normal ENMT: Mucous membranes are moist. Posterior pharynx clear of any exudate or lesions.Normal dentition.  Neck: normal, supple, no masses, no thyromegaly Respiratory: Inspiratory and expiratory crackles. Cardiovascular: IRR, IRR.  4+ BLE edema Abdomen: no tenderness, no masses palpated. No hepatosplenomegaly. Bowel sounds positive.  Musculoskeletal: no clubbing / cyanosis. No joint deformity upper and lower extremities. Good ROM, no contractures. Normal muscle tone.  Skin: no rashes, lesions, ulcers. No induration Neurologic: CN 2-12 grossly intact. Sensation intact, DTR normal. Strength 5/5 in all 4.  Psychiatric: Normal judgment and insight. Alert and oriented x 3. Normal mood.    Labs on Admission: I have personally reviewed following labs and imaging studies  CBC: Recent Labs  Lab 03/21/21 2047 03/21/21 2138 03/22/21 0214  WBC 14.3*  --  14.0*  NEUTROABS 12.4*  --   --   HGB 16.4 16.7 16.3  HCT 49.4 49.0 49.5  MCV 94.5  --  94.5  PLT 129*  --  PENDING   Basic Metabolic Panel: Recent Labs  Lab 03/21/21 2047 03/21/21 2138 03/22/21 0214  NA 138 142 140  K 5.7* 3.6 3.8  CL 106  --  109  CO2 24  --  22  GLUCOSE 108*  --  101*  BUN 22  --  20  CREATININE 0.96  --  0.87  CALCIUM 8.6*  --  8.7*   GFR: Estimated Creatinine Clearance: 75.8 mL/min (by C-G formula based on SCr of 0.87 mg/dL). Liver Function Tests: Recent Labs  Lab 03/21/21 2047  AST 68*  ALT 28  ALKPHOS 70  BILITOT 3.7*  PROT 5.6*  ALBUMIN 2.9*   No results for input(s): LIPASE, AMYLASE in the last 168 hours. No results for input(s): AMMONIA in the last 168 hours. Coagulation Profile: No results for input(s): INR, PROTIME in the last 168 hours. Cardiac Enzymes: No results for input(s): CKTOTAL, CKMB, CKMBINDEX, TROPONINI in the last 168  hours. BNP (last 3 results) No results for input(s): PROBNP in the last 8760 hours. HbA1C: No results for input(s): HGBA1C in the last 72 hours. CBG: No results for input(s): GLUCAP in the last 168 hours. Lipid Profile: No results for input(s): CHOL, HDL, LDLCALC, TRIG, CHOLHDL, LDLDIRECT in the last 72 hours. Thyroid Function Tests: No results for input(s): TSH, T4TOTAL, FREET4, T3FREE, THYROIDAB in the last 72 hours. Anemia Panel: No results for input(s): VITAMINB12, FOLATE, FERRITIN, TIBC, IRON, RETICCTPCT in the last 72 hours. Urine analysis: No results found for: COLORURINE, APPEARANCEUR, [place],  PHURINE, GLUCOSEU, HGBUR, BILIRUBINUR, KETONESUR, PROTEINUR, UROBILINOGEN, NITRITE, LEUKOCYTESUR  Radiological Exams on Admission: DG Chest 2 View  Result Date: 03/21/2021 CLINICAL DATA:  Lethargy, generalized weakness, dyspnea on exertion, history of fibrosis EXAM: CHEST - 2 VIEW COMPARISON:  04/21/2020 FINDINGS: Frontal and lateral views of the chest demonstrate mild enlargement of the cardiac silhouette, which may be related to positioning and AP technique. Diffuse fibrotic changes are seen throughout the lungs, with extensive superimposed bilateral ground-glass airspace disease. Trace bilateral effusions. No pneumothorax. IMPRESSION: 1. Background pulmonary fibrosis, with superimposed ground-glass opacities compatible with edema or multifocal pneumonia. 2. Trace bilateral pleural effusions. Electronically Signed   By: Randa Ngo M.D.   On: 03/21/2021 21:29    EKG: Independently reviewed.  Assessment/Plan Principal Problem:   Acute on chronic respiratory failure with hypoxia (HCC) Active Problems:   IPF (idiopathic pulmonary fibrosis) (HCC)   Chronic respiratory failure with hypoxia (HCC)   Weight loss, unintentional    1. Acute on chronic resp failure with hypoxia - 1. DDx for acute component today: 1. CHF 2. AEIPF 3. Bilateral PNA 2. Favor CHF at the moment  given: 1. Severe BLE edema 2. Superimposed bilat ground glass opacities 3. BNP 613 3. Trying 41m IV lasix now then 447mIV lasix BID with next dose ordered for 1800 today 4. Strict intake and output 5. 2D echo 6. Repeat BMP pending now (determine K replacement needs based on results). 1. Daily BMP ordered 7. Procalcitonin pending 8. Got Cefepime in ED, will leave him on cefepime per pharm and add azithromycin for empiric PNA coverage for the moment. 1. Check MRSA PCR nares 9. Regarding steroids: 1. If pt has CHF contributing significantly to his acute presentation, steroids can worsen fluid retention. 2. Although steroids are indicated if AEIPF is felt to be the major cause of his presentation. 3. Will see how he responds to lasix for the moment. 4. Defer starting steroids to PCCM. 10. Tele monitor 11. Cont pulse ox 12. PCCM will see in consult in AM 13. SW consult for possible SNF placement given debility 2. A.Fib - 1. Cont eliquis 2. Cont BB 3. Tele monitor 4. Is rate controlled at the moment 5. 2d echo ordered for AM  DVT prophylaxis: Eliquis Code Status: DNR/DNI - confirmed no intubation with wife Family Communication: Spoke with Wife on phone Disposition Plan: TBD Consults called: Dr. SoOletta Darterdmission status: Admit to inpatient  Severity of Illness: The appropriate patient status for this patient is INPATIENT. Inpatient status is judged to be reasonable and necessary in order to provide the required intensity of service to ensure the patient's safety. The patient's presenting symptoms, physical exam findings, and initial radiographic and laboratory data in the context of their chronic comorbidities is felt to place them at high risk for further clinical deterioration. Furthermore, it is not anticipated that the patient will be medically stable for discharge from the hospital within 2 midnights of admission. The following factors support the patient status of inpatient.    Patient has acute respiratory failure with hypoxia due to having a new oxygen requirement.  That is the patient has a PaO2 < 60 (pulse Ox < 90%) on room air.  Pt with 15L NRB O2 requirement at this time.  * I certify that at the point of admission it is my clinical judgment that the patient will require inpatient hospital care spanning beyond 2 midnights from the point of admission due to high intensity of service, high risk for further deterioration  and high frequency of surveillance required.*    Psalms Olarte M. DO [facility]  How to contact the [facility] Attending or Consulting provider [place] or covering provider during after hours [place], for this patient?  1. Check the care team in [facility] and look for a) attending/consulting [facility] provider listed and b) the [facility] team listed 2. Log into [email or web address]  Amion Physician Scheduling and messaging for groups and whole hospitals  On call and physician scheduling software for group practices, residents, hospitalists and other medical providers for call, clinic, rotation and shift schedules. OnCall [employer] is a hospital-wide system for scheduling doctors and paging doctors on call. EasyPlot is for scientific plotting and data analysis.  [email or web address]  and use [place]'s universal password to access. If you do not have the password, please contact the hospital operator.  3. Locate the [facility] provider you are looking for under [facility] and page to a number that you can be directly reached. 4. If you still have difficulty reaching the provider, please page the [facility] (Director on Call) for the Hospitalists listed on amion for assistance.  03/22/2021, 2:59 AM

## 2021-03-22 NOTE — Progress Notes (Signed)
Pharmacy Antibiotic Note  Roger Sims is a 75 y.o. male admitted on 03/21/2021 with pneumonia.  Pharmacy has been consulted for cefepime dosing.  Plan: Cefepime 2gm IV q8 hours F/u renal function, cultures and clinical course    Temp (24hrs), Avg:97.7 F (36.5 C), Min:97.7 F (36.5 C), Max:97.7 F (36.5 C)  Recent Labs  Lab 03/21/21 2047 03/22/21 0214  WBC 14.3* 14.0*  CREATININE 0.96 0.87    Estimated Creatinine Clearance: 75.8 mL/min (by C-G formula based on SCr of 0.87 mg/dL).    Allergies  Allergen Reactions  . Penicillins Swelling  . Tetracyclines & Related Anaphylaxis    Gi upset Gi upset   . Metoprolol Tartrate Rash    itching itching   . Prasugrel Rash     Thank you for allowing pharmacy to be a part of this patient's care.  Excell Seltzer Poteet 03/22/2021 3:15 AM

## 2021-03-22 NOTE — ED Notes (Signed)
Pt IV pump started beeping - this RN found pt to have ripped out his IV and taken his O2 off and off of the O2 monitor. Pt placed back on NRB and O2 monitor reading in the 90s. Pt IV antibiotics paused. Pt IV site cleaned up. This RN to obtain new IV access.  Pt reports no pain at this time.

## 2021-03-22 NOTE — Consult Note (Signed)
Consultation Note Date: 03/22/2021   Patient Name: Roger Sims  DOB: 05-Sep-1946  MRN: [id number]  Age / Sex: 75 y.o., male  PCP: Thomes Dinning, MD Referring Physician: Cristal Ford, DO  Reason for Consultation: Establishing goals of care  HPI/Patient Profile: 75 y.o. 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 He presented to the emergency department on 03/21/2021 with shortness of breath. Reporting 2 day acute deterioration - inability to ambulate, severe shortness of breath, and O2 saturation of 63% at home despite 6 liters of oxygen.  ED Course: BNP 613, WBC 14k. Chest x-ray shows ground glass opacities superimposed on top of his chronic IPF findings; radiologist suggests superimposed pneumonia or pulmonary edema. Admitted to [facility] with acute on chronic respiratory failure.  Clinical Assessment and Goals of Care: I have reviewed medical records including EPIC notes, labs and imaging. Patient was diagnosed with IPF in September/October of 2021. He was seen 5/25 by his pulmonologist. In the progress note from this visit, it is reported that patient had "dramatically declined" since his last visit. Lung function had also decreased. They had discussed possibly starting inhaled treprostinil, and that patient would need a heart cath first. They had also discussed hospice care, but patient and wife were not ready at that time.   On my assessment, patient seems confused and has just removed his oxygen. O2 saturations quickly drop into the 70's. His RN has come into the room, and we quickly place the patient back on oxygen.   I spoke with his wife [name] by phone to discuss diagnosis, prognosis, [place], EOL wishes, disposition, and options. I introduced Palliative Medicine as specialized medical care for people living with serious illness.  It focuses on providing relief from the symptoms and stress of a serious illness.   [name] shares that today has been very difficult. She states that it was explained earlier today that [name] will most likely pass in the hospital in the next few days and that he is not even stable to transfer to a hospice facility due to his high oxygen requirements. She states this was very difficult news to hear - they were both understandably emotional.  After being up most of the night, she decided to go home and take a break. She seems exhausted. She plans to return to the hospital in the in the morning and asks if we can talk further then. Plan to meet at bedside tomorrow at 11 am.   Primary decision maker: Wife [name]    SUMMARY OF RECOMMENDATIONS    DNR/DNI as previously documented  Continue current medical care  Gregory meeting tomorrow at 11 am   Symptom Management:   Morphine 1-4 mg IV as needed for pain or dyspnea  Midazolam (Versed) 1-4 mg IV every 2 hours as needed for anxiety  Palliative Prophylaxis:   Turn/reposition, frequent pain assessment   Psycho-social/Spiritual:   Created space and opportunity for family to express thoughts and feelings regarding patient's current medical  situation.   Emotional support provided   Prognosis:   Days   Discharge Planning: anticipate hospital death     Primary Diagnoses: Present on Admission: . Chronic respiratory failure with hypoxia ([place]) . IPF (idiopathic pulmonary fibrosis) ([place]) . Weight loss, unintentional . Acute on chronic respiratory failure with hypoxia ([place]) . A-fib ([place]) . DNR (do not resuscitate) . Protein calorie malnutrition ([place]) . WHO group 3 pulmonary arterial hypertension ([place]) . Hyperlipidemia . Shortness of breath   I have reviewed the medical record, interviewed the patient and family, and examined the patient. The following aspects are pertinent.  Past Medical History:  Diagnosis Date  . A-fib ([place])    . CAD (coronary artery disease)   . Hypertension   . IPF (idiopathic pulmonary fibrosis) (H[facility])     Family History  Problem Relation Age of Onset  . Melanoma Mother   . Lymphoma Father   . Heart disease Father    Scheduled Meds: . apixaban  5 mg Oral BID  . furosemide  40 mg Intravenous BID  . latanoprost  1 drop Both Eyes QHS  . methylPREDNISolone (SOLU-MEDROL) injection  125 mg Intravenous Q6H  . nebivolol  10 mg Oral Daily  . rosuvastatin  20 mg Oral Daily   Continuous Infusions: . azithromycin Stopped (03/22/21 0552)  . ceFEPime (MAXIPIME) IV Stopped (03/22/21 1420)  . dextrose 5% lactated ringers 10 mL/hr at 03/22/21 1420   PRN Meds:.acetaminophen **OR** acetaminophen, ALPRAZolam, midazolam, morphine injection, ondansetron **OR** ondansetron (ZOFRAN) IV Medications Prior to Admission:  Prior to Admission medications   Medication Sig Start Date End Date Taking? Authorizing Provider  ALPRAZolam (XANAX) 0.25 MG tablet Take 1 tablet (0.25 mg total) by mouth at bedtime as needed for anxiety. Patient taking differently: Take 0.25 mg by mouth daily as needed for anxiety. 03/16/21  Yes Brand Males, MD  ELIQUIS 5 MG TABS tablet Take 5 mg by mouth 2 (two) times daily. 12/31/20  Yes [provider]  furosemide (LASIX) 20 MG tablet Take 20 mg by mouth daily as needed for fluid or edema. 03/10/21  Yes [provider]  guaiFENesin (MUCINEX) 600 MG 12 hr tablet Take 600 mg by mouth 2 (two) times daily as needed for cough.   Yes [provider]  latanoprost (XALATAN) 0.005 % ophthalmic solution Place 1 drop into both eyes at bedtime. 01/07/17  Yes [provider]  nebivolol (BYSTOLIC) 10 MG tablet Take 10 mg by mouth daily. 11/27/16  Yes [provider]  rosuvastatin (CRESTOR) 20 MG tablet Take 20 mg by mouth daily. 01/03/21  Yes [provider]   Allergies  Allergen Reactions  . Penicillins Swelling  . Tetracyclines & Related  Anaphylaxis    Gi upset Gi upset   . Metoprolol Tartrate Rash    itching itching   . Prasugrel Rash   Review of Systems  Unable to perform ROS: Unstable vital signs    Physical Exam Vitals reviewed.  Constitutional:      Appearance: He is ill-appearing.  Cardiovascular:     Rate and Rhythm: Normal rate.  Pulmonary:     Effort: Tachypnea present.  Neurological:     Mental Status: He is confused.     Vital Signs: BP (!) 147/97   Pulse 85   Temp 98.2 F (36.8 C) (Oral)   Resp (!) 27   SpO2 96%  Pain Scale: 0-10   Pain Score: Asleep   SpO2: SpO2: 96 % O2 Device:SpO2: 96 % O2 Flow  Rate: .O2 Flow Rate (L/min): 12 L/min  IO: Intake/output summary:   Intake/Output Summary (Last 24 hours) at 03/22/2021 1422 Last data filed at 03/22/2021 9628 Gross per 24 hour  Intake --  Output 950 ml  Net -950 ml     Palliative Assessment/Data: PPS 20%     Time In: 1430 Time Out: 1505 Time Total: 35 minutes Greater than 50%  of this time was spent counseling and coordinating care related to the above assessment and plan.  Signed by: Lavena Bullion, NP   Please contact Palliative Medicine Team phone at [phone] for questions and concerns.  For individual provider: See Shea Evans

## 2021-03-22 NOTE — ED Notes (Signed)
Pt placed on NRB @ 15L as pt removed [place]. SpO2 97% at this time.

## 2021-03-22 NOTE — ED Notes (Signed)
Echo at bedside.

## 2021-03-22 NOTE — ED Notes (Signed)
Pt desatting into 80s on [place]. Pt placed on NRB. Dr. Alcario Drought notified.

## 2021-03-22 NOTE — ED Notes (Signed)
Breakfast tray at bedside 

## 2021-03-22 NOTE — Consult Note (Signed)
NAME:  Roger Sims, MRN:  [id number], DOB:  January 06, 1946, LOS: 0 ADMISSION DATE:  03/21/2021, CONSULTATION DATE:  03/22/21 REFERRING MD:  Alcario Drought CHIEF COMPLAINT:  Hypoxic Respiratory Failure   History of Present Illness:  Roger Sims is a 75 y.o. male who has a PMH including but not limited to IPF.  He is followed by Dr. Chase Caller in the office and was last seen on 03/16/2021.  At the time, Dr. Chase Caller felt that he had had significant decline since prior visit and had a very poor prognosis.  Hospice was discussed and recommended; however, patient and wife preferred home palliation versus hospice.  They also discussed inhaled treprostinil and they were interested in possibly considering this.  Dr. Chase Caller sent a note to Dr. Haroldine Laws and Aundra Dubin regarding possibly getting a right heart cath since he would need this prior to any consideration of inhaled treprostinil.  The time was to improve quality of life and the plan was that if he continued to decline then they would need to put more emphasis on considering home hospice.  Unfortunately on 5/30, he had acute decline.  O2 saturations dropped down into the 60s.  Wife called the on-call pager and was instructed to take him to the ED as she was not able to manage.  In the ED, he was admitted by the hospitalist team for worsening hypoxic respiratory failure and possible underlying pneumonia and/or pulmonary edema.Marland Kitchen  He was started on Lasix as well as empiric cefepime and azithromycin.  PCCM was asked to see him for further recommendations.   Pertinent  Medical History:  has IPF (idiopathic pulmonary fibrosis) ([place]); Pulmonary emphysema ([place]); Chronic respiratory failure with hypoxia ([place]); Weight loss, unintentional; Healthcare maintenance; Acute on chronic respiratory failure with hypoxia ([place]); and A-fib ([place]) on their problem list.  Significant Hospital Events: Including procedures, antibiotic start and stop dates in addition to other pertinent  events   5/30 > admit. 5/31 > PCCM consult.  Micro Data: Flu 5/30 > neg. SARS CoV2 5/30 > neg.  Antibiotics: Cefepime 5/30 >  Azithromycin 5/30 >   Interim History / Subjective:  Comfortable on 15L NRB.  Sats 98%.  Can likely drop O2 support some. Feels breathing slightly better.  Objective:  Blood pressure 119/85, pulse 100, temperature 98.2 F (36.8 C), temperature source Oral, resp. rate (!) 34, SpO2 94 %.        Intake/Output Summary (Last 24 hours) at 03/22/2021 0844 Last data filed at 03/22/2021 3329 Gross per 24 hour  Intake --  Output 950 ml  Net -950 ml   There were no vitals filed for this visit.  Examination: General: Frail elderly male, resting in bed, in NAD. Neuro: A&O x 3, no deficits. HEENT: /AT. Sclerae anicteric. EOMI. Cardiovascular: IRIR, no M/R/G.  Lungs: Respirations even and unlabored.  Faint crackles and slight wheezing. Abdomen: BS x 4, soft, NT/ND.  Musculoskeletal: No gross deformities, no edema.  Skin: Intact, warm, no rashes.   Assessment & Plan:   Acute on chronic hypoxic respiratory failure secondary to known IPF now with exacerbation/flare. - Continue supplemental oxygen to maintain O2 sats greater than 85%. -Trial of steroids given his acute flare. - Continue bronchodilators. - Continue diuresis for goal negative balance. - Based on Dr. Golden Pop notes, I do support patient and family moving towards hospice.  Possible underlying PNA - not fully convinced per imaging and neg PCT is reassuring. - Reasonable to continue abx short course given steroid burst during acute  flare.  Dr. Chase Caller to see shortly.   Rest per primary team.  Labs   CBC: Recent Labs  Lab 03/21/21 2047 03/21/21 2138 03/22/21 0214  WBC 14.3*  --  14.0*  NEUTROABS 12.4*  --   --   HGB 16.4 16.7 16.3  HCT 49.4 49.0 49.5  MCV 94.5  --  94.5  PLT 129*  --  PLATELET CLUMPS NOTED ON SMEAR, UNABLE TO ESTIMATE    Basic Metabolic Panel: Recent Labs   Lab 03/21/21 2047 03/21/21 2138 03/22/21 0214  NA 138 142 140  K 5.7* 3.6 3.8  CL 106  --  109  CO2 24  --  22  GLUCOSE 108*  --  101*  BUN 22  --  20  CREATININE 0.96  --  0.87  CALCIUM 8.6*  --  8.7*   GFR: Estimated Creatinine Clearance: 75.8 mL/min (by C-G formula based on SCr of 0.87 mg/dL). Recent Labs  Lab 03/21/21 2047 03/22/21 0214  PROCALCITON  --  <0.10  WBC 14.3* 14.0*    Liver Function Tests: Recent Labs  Lab 03/21/21 2047  AST 68*  ALT 28  ALKPHOS 70  BILITOT 3.7*  PROT 5.6*  ALBUMIN 2.9*   No results for input(s): LIPASE, AMYLASE in the last 168 hours. No results for input(s): AMMONIA in the last 168 hours.  ABG    Component Value Date/Time   PHART 7.484 (H) 03/21/2021 2138   PCO2ART 31.5 (L) 03/21/2021 2138   PO2ART 91 03/21/2021 2138   HCO3 23.8 03/21/2021 2138   TCO2 25 03/21/2021 2138   O2SAT 98.0 03/21/2021 2138     Coagulation Profile: No results for input(s): INR, PROTIME in the last 168 hours.  Cardiac Enzymes: No results for input(s): CKTOTAL, CKMB, CKMBINDEX, TROPONINI in the last 168 hours.  HbA1C: No results found for: HGBA1C  CBG: No results for input(s): GLUCAP in the last 168 hours.  Review of Systems:   All negative; except for those that are bolded, which indicate positives.  Constitutional: weight loss, weight gain, night sweats, fevers, chills, fatigue, weakness.  HEENT: headaches, sore throat, sneezing, nasal congestion, post nasal drip, difficulty swallowing, tooth/dental problems, visual complaints, visual changes, ear aches. Neuro: difficulty with speech, weakness, numbness, ataxia. CV:  chest pain, orthopnea, PND, swelling in lower extremities, dizziness, palpitations, syncope.  Resp: cough, hemoptysis, dyspnea, wheezing. GI: heartburn, indigestion, abdominal pain, nausea, vomiting, diarrhea, constipation, change in bowel habits, loss of appetite, hematemesis, melena, hematochezia.  GU: dysuria, change in  color of urine, urgency or frequency, flank pain, hematuria. MSK: joint pain or swelling, decreased range of motion. Psych: change in mood or affect, depression, anxiety, suicidal ideations, homicidal ideations. Skin: rash, itching, bruising.   Past Medical History:  He,  has a past medical history of A-fib ([place]), CAD (coronary artery disease), Hypertension, and IPF (idiopathic pulmonary fibrosis) ([place]).   Surgical History:   Past Surgical History:  Procedure Laterality Date  . BACK SURGERY    . COLONOSCOPY    . CORONARY ANGIOPLASTY WITH STENT PLACEMENT    . FOOT SURGERY    . RHINOPLASTY    . UVULECTOMY    . VASECTOMY       Social History:   reports that he quit smoking about 40 years ago. His smoking use included cigarettes. He has a 22.50 pack-year smoking history. He has quit using smokeless tobacco. He reports previous alcohol use. He reports that he does not use drugs.   Family History:  His  family history includes Heart disease in his father; Lymphoma in his father; Melanoma in his mother.   Allergies Allergies  Allergen Reactions  . Penicillins Swelling  . Tetracyclines & Related Anaphylaxis    Gi upset Gi upset   . Metoprolol Tartrate Rash    itching itching   . Prasugrel Rash     Home Medications  Prior to Admission medications   Medication Sig Start Date End Date Taking? Authorizing Provider  ALPRAZolam (XANAX) 0.25 MG tablet Take 1 tablet (0.25 mg total) by mouth at bedtime as needed for anxiety. Patient taking differently: Take 0.25 mg by mouth daily as needed for anxiety. 03/16/21  Yes Brand Males, MD  ELIQUIS 5 MG TABS tablet Take 5 mg by mouth 2 (two) times daily. 12/31/20  Yes [provider]  furosemide (LASIX) 20 MG tablet Take 20 mg by mouth daily as needed for fluid or edema. 03/10/21  Yes [provider]  guaiFENesin (MUCINEX) 600 MG 12 hr tablet Take 600 mg by mouth 2 (two) times daily as needed for cough.   Yes [provider]  latanoprost (XALATAN) 0.005 % ophthalmic solution Place 1 drop into both eyes at bedtime. 01/07/17  Yes [provider]  nebivolol (BYSTOLIC) 10 MG tablet Take 10 mg by mouth daily. 11/27/16  Yes [provider]  rosuvastatin (CRESTOR) 20 MG tablet Take 20 mg by mouth daily. 01/03/21  Yes [provider]     Montey Hora, [place] For pager details, please see AMION or use Epic chat  After 1900, please call [facility] for cross coverage needs 03/22/2021, 8:44 AM

## 2021-03-22 NOTE — ED Notes (Signed)
Breakfast order placed

## 2021-03-22 NOTE — Progress Notes (Signed)
Patient admitted earlier today by Dr. Jennette Kettle. See full H&P for details.   75 year old male with history of IPF, CAD, hypertension, A. fib on Eliquis, presented with shortness of breath.  Patient was recently seen by Dr. Chase Caller in the clinic on 5/25.    On examination, frail, chronically ill-appearing male in no apparent distress.  Patient noted to have faint crackles and slight wheezing on lung exam.  Irregular rhythm and rate.  Patient was admitted with acute on chronic respiratory failure.  Have placed him on IV Solu-Medrol 125 mg every 6 hours as per recommendation from pulmonology.  Question if there is bilateral pneumonia, patient placed on antibiotics.  Procalcitonin unremarkable.  Pending respiratory viral panel, urine strep and Legionella antigens as well as CRP.  Palliative care consultation also placed.   Time spent: 20 minutes  Alpheus Stiff D.O. [facility]  If 7PM-7AM, please contact night-coverage [email or web address] 03/22/2021, 1:29 PM

## 2021-03-23 ENCOUNTER — Encounter (HOSPITAL_COMMUNITY): Payer: Self-pay | Admitting: Internal Medicine

## 2021-03-23 DIAGNOSIS — Z66 Do not resuscitate: Secondary | ICD-10-CM | POA: Diagnosis not present

## 2021-03-23 DIAGNOSIS — J9621 Acute and chronic respiratory failure with hypoxia: Secondary | ICD-10-CM | POA: Diagnosis not present

## 2021-03-23 DIAGNOSIS — I5033 Acute on chronic diastolic (congestive) heart failure: Secondary | ICD-10-CM

## 2021-03-23 DIAGNOSIS — I2723 Pulmonary hypertension due to lung diseases and hypoxia: Secondary | ICD-10-CM

## 2021-03-23 LAB — LEGIONELLA PNEUMOPHILA SEROGP 1 UR AG: L. pneumophila Serogp 1 Ur Ag: NEGATIVE

## 2021-03-23 LAB — BASIC METABOLIC PANEL
Anion gap: 12 (ref 5–15)
BUN: 24 mg/dL — ABNORMAL HIGH (ref 8–23)
CO2: 27 mmol/L (ref 22–32)
Calcium: 8.9 mg/dL (ref 8.9–10.3)
Chloride: 102 mmol/L (ref 98–111)
Creatinine, Ser: 0.95 mg/dL (ref 0.61–1.24)
GFR, Estimated: >60 mL/min (ref >60)
Glucose, Bld: 130 mg/dL — ABNORMAL HIGH (ref 70–99)
Potassium: 3.7 mmol/L (ref 3.5–5.1)
Sodium: 141 mmol/L (ref 135–145)

## 2021-03-23 LAB — PROCALCITONIN: Procalcitonin: <0.1 ng/mL

## 2021-03-23 MED ORDER — HYDROCODONE-ACETAMINOPHEN 7.5-325 MG PO TABS
1.0000 | ORAL_TABLET | Freq: Four times a day (QID) | ORAL | Status: DC | PRN
Start: 2021-03-23 — End: 2021-03-23

## 2021-03-23 NOTE — Progress Notes (Signed)
Daily Progress Note   Patient Name: Roger Sims       Date: 03/23/2021 DOB: [birth year]  Age: 75 y.o. MRN#: [id number] Attending Physician: Dessa Phi, DO Primary Care Physician: Thomes Dinning, MD Admit Date: 03/21/2021  Reason for Consultation/Follow-up: Establishing goals of care  Subjective: Chart reviewed. Oxygen requirements have decreased to 6L. I was notified by Dr. Maylene Roes that PCCM is requesting cardiology consult to make sure there is not a reversible component contributing to his current illness.  On my assessment, patient is intermittently drowsy but able to participate in conversation and answer questions appropriately.   Wife Roger Sims is at bedside. She expresses doubts about wanting to proceed with an invasive procedure such as a heart cath. Patient himself states he does not want to undergo heart cath if he is going to remain bed-bound. Patient and wife both express that bed-bound status is not consistent with good quality of life for him.   The difference between full scope medical intervention and comfort care was considered.  We discussed the concept of a comfort path, emphasizing that this path involves de-escalating full scope medical interventions, allowing a natural course to occur. Discussed that the goal is comfort and dignity rather than prolonging life. Introduced hospice philosophy and provided information on home vs residential hospice services.   Roger Sims is not able to care for Roger Sims in his current condition. Discussed that if they decide to transition to comfort care, he would likely be eligible for residential hospice.    Length of Stay: 1  Current Medications: Scheduled Meds:  . apixaban  5 mg Oral BID  . furosemide  40 mg Intravenous BID  .  latanoprost  1 drop Both Eyes QHS  . methylPREDNISolone (SOLU-MEDROL) injection  125 mg Intravenous Q6H  . nebivolol  10 mg Oral Daily  . rosuvastatin  20 mg Oral Daily    Continuous Infusions: . dextrose 5% lactated ringers 10 mL/hr at 03/23/21 0456    PRN Meds: acetaminophen **OR** acetaminophen, ALPRAZolam, midazolam, morphine injection, ondansetron **OR** ondansetron (ZOFRAN) IV  Physical Exam Constitutional:      General: He is not in acute distress.    Appearance: He is ill-appearing.  Cardiovascular:     Rate and Rhythm: Normal rate.  Pulmonary:     Effort: Pulmonary effort is normal. Tachypnea present.  Neurological:     Motor: Weakness present.             Vital Signs: BP (!) 146/97 (BP Location: Left Arm)   Pulse 72   Temp (!) 97.2 F (36.2 C) (Axillary)   Resp 18   Ht _0  (1.803 m)   Wt 74 kg   SpO2 97%   BMI 22.75 kg/m  SpO2: SpO2: 97 % O2 Device: O2 Device: Nasal Cannula O2 Flow Rate: O2 Flow Rate (L/min): 6 L/min  Intake/output summary:   Intake/Output Summary (Last 24 hours) at 03/23/2021 1349 Last data filed at 03/23/2021 0255 Gross per 24 hour  Intake --  Output 2500 ml  Net -2500 ml   LBM: Last BM Date: 03/19/21 (per pt) Baseline Weight: Weight: 74 kg Most recent weight: Weight: 74 kg       Palliative Assessment/Data: PPS 30%      Patient Active Problem List   Diagnosis Date Noted  . A-fib ([place]) 03/22/2021  . DNR (do not resuscitate) 03/22/2021  . Goals of care, counseling/discussion 03/22/2021  . Shortness of breath 03/22/2021  . Palliative care status 03/22/2021  . Caregiver burden 03/22/2021  . Hyperlipidemia   . Acute on chronic respiratory failure with hypoxia ([place]) 03/21/2021  . Protein calorie malnutrition ([place]) 03/21/2021  . 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 IPF (idiopathic pulmonary fibrosis) ([place]) 09/15/2020  . Pulmonary emphysema ([place]) 09/15/2020  . Chronic respiratory failure with  hypoxia ([place]) 09/15/2020  . Weight loss, unintentional 09/15/2020  . Healthcare maintenance 09/15/2020  . History of MI (myocardial infarction) 03/22/2010    Palliative Care Assessment & Plan   HPI/Patient Profile: 75 y.o. 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 He presented to the emergency department on 03/21/2021 with shortness of breath. Reporting 2 day acute deterioration - inability to ambulate, severe shortness of breath, and O2 saturation of 63% at home despite 6 liters of oxygen.  ED Course: BNP 613, WBC 14k. Chest x-ray shows ground glass opacities superimposed on top of his chronic IPF findings; radiologist suggests superimposed pneumonia or pulmonary edema. Admitted to [facility] with acute on chronic respiratory failure.  Assessment: - acute on chronic respiratory failure with hypoxia - idiopathic pulmonary fibrosis - acute systolic CHF - WHO group 3 pulmonary arterial hypertension - A fib  Recommendations/Plan: DNR/DNI as previously documented Continue current medical interventions for now Awaiting cardiology recommendations Patient /family considering transition to comfort care PMT will follow-up tomorrow   Code Status: DNR/DNI  Prognosis:  poor overall  Discharge Planning: To Be Determined  Care plan was discussed with Dr. Maylene Roes and bedside RN  Thank you for allowing the Palliative Medicine Team to assist in the care of this patient.   Total Time 35 minutes Prolonged Time Billed  no       Greater than 50%  of this time was spent counseling and coordinating care related to the above assessment and plan.  Lavena Bullion, NP  Please contact Palliative Medicine Team phone at [phone] for questions and concerns.

## 2021-03-23 NOTE — Progress Notes (Signed)
Heart Failure Navigator Progress Note  Assessed for Heart & Vascular TOC clinic readiness.  Unfortunately at this time the patient does not meet criteria due to pending [place] discussion with palliative care.   Navigator available for reassessment of patient.   Kerby Nora, PharmD, BCPS Heart Failure Stewardship Pharmacist Phone [phone]

## 2021-03-23 NOTE — Consult Note (Signed)
Cardiology Consultation:   Patient ID: Roger Sims MRN: [id number]; DOB: 1946/03/20  Admit date: 03/21/2021 Date of Consult: 03/23/2021  PCP:  Thomes Dinning, MD   [facility]Care Providers Cardiologist:  Dr Marijo File     Patient Profile:   Roger Sims is a 75 y.o. male with a hx of pulmonary fibrosis, coronary artery disease, hypertension, hyperlipidemia, atrial fibrillation who is being seen 03/23/2021 for the evaluation of possible congestive heart failure at the request of Brand Males MD.  History of Present Illness:     Patient had an echocardiogram performed January 25, 2021 at [facility] that showed normal LV function, mild left ventricular hypertrophy, mild to moderate right ventricular enlargement, severe right atrial enlargement, mild tricuspid regurgitation.  He has a long history of pulmonary fibrosis and has deteriorated rapidly over the past several months.  Patient saw Dr. Chase Caller on May 25 and hospice was considered.  There is also consideration for right heart cath and treprostinil.  However patient continued to decline.  He was admitted May 31 with 3 days of progressive confusion, weakness, falling, hypoxemia with saturations as low as 61 and dyspnea.  He has been treated with steroids, antibiotics and Lasix with mild improvement.  Cardiology now asked to evaluate.  Patient states he has occasional pain in his chest in the early morning.  He has chronic dyspnea.  Also with chronic mild pedal edema.  Recently placed on Lasix for edema.   Past Medical History:  Diagnosis Date  . A-fib ([place])   . CAD (coronary artery disease)   . Hypertension   . IPF (idiopathic pulmonary fibrosis) ([place])     Past Surgical History:  Procedure Laterality Date  . BACK SURGERY    . COLONOSCOPY    . CORONARY ANGIOPLASTY WITH STENT PLACEMENT    . FOOT SURGERY    . RHINOPLASTY    . UVULECTOMY    . VASECTOMY      Inpatient Medications: Scheduled Meds: . apixaban  5 mg Oral BID  .  furosemide  40 mg Intravenous BID  . latanoprost  1 drop Both Eyes QHS  . methylPREDNISolone (SOLU-MEDROL) injection  125 mg Intravenous Q6H  . nebivolol  10 mg Oral Daily  . rosuvastatin  20 mg Oral Daily   Continuous Infusions: . dextrose 5% lactated ringers 10 mL/hr at 03/23/21 0456   PRN Meds: acetaminophen **OR** acetaminophen, ALPRAZolam, midazolam, morphine injection, ondansetron **OR** ondansetron (ZOFRAN) IV  Allergies:    Allergies  Allergen Reactions  . Penicillins Swelling  . Tetracyclines & Related Anaphylaxis    Gi upset Gi upset   . Metoprolol Tartrate Rash    itching itching   . Prasugrel Rash    Social History:   Social History   Socioeconomic History  . Marital status: Married    Spouse name: Not on file  . Number of children: Not on file  . Years of education: Not on file  . Highest education level: Not on file  Occupational History  . Not on file  Tobacco Use  . Smoking status: Former Smoker    Packs/day: 1.50    Years: 15.00    Pack years: 22.50    Types: Cigarettes    Quit date: 1982    Years since quitting: 40.4  . Smokeless tobacco: Former Systems developer  . Tobacco comment: 40 years ago  Substance and Sexual Activity  . Alcohol use: Not Currently  . Drug use: Never  . Sexual activity: Not on file  Other  Topics Concern  . Not on file  Social History Narrative  . Not on file   Social Determinants of Health   Financial Resource Strain: Not on file  Food Insecurity: Not on file  Transportation Needs: Not on file  Physical Activity: Not on file  Stress: Not on file  Social Connections: Not on file  Intimate Partner Violence: Not on file    Family History:   Family History  Problem Relation Age of Onset  . Melanoma Mother   . Lymphoma Father   . Heart disease Father      ROS:  Please see the history of present illness.  Recent hemoptysis and productive cough.  Chronic pedal edema.  Generalized weakness. All other ROS reviewed and  negative.     Physical Exam/Data:   Vitals:   03/22/21 1915 03/22/21 1943 03/23/21 0513 03/23/21 0755  BP: (!) 147/98  (!) 126/98 (!) 146/97  Pulse: 83  90 72  Resp: (!) 35  18   Temp:   98.2 F (36.8 C) (!) 97.2 F (36.2 C)  TempSrc:    Axillary  SpO2: 94%  96% 97%  Weight:  74 kg    Height:  _0  (1.803 m)      Intake/Output Summary (Last 24 hours) at 03/23/2021 1418 Last data filed at 03/23/2021 0255 Gross per 24 hour  Intake --  Output 2500 ml  Net -2500 ml   Last 3 Weights 03/22/2021 03/16/2021 01/13/2021  Weight (lbs) 163 lb 2.3 oz 161 lb 161 lb  Weight (kg) 74 kg 73.029 kg 73.029 kg     Body mass index is 22.75 kg/m.  General:  Well nourished, well developed, in no acute distress HEENT: normal Lymph: no adenopathy Neck: no JVD Endocrine:  No thryomegaly Vascular: No carotid bruits; FA pulses 2+ bilaterally without bruits  Cardiac:  normal S1, S2; RRR; 2/6 systolic murmur left sternal border. Lungs: Diffuse crackles Abd: soft, nontender, no hepatomegaly  Ext: 1+ ankle edema Musculoskeletal:  No deformities, BUE and BLE strength normal and equal Skin: warm and dry  Neuro:  CNs 2-12 intact, no focal abnormalities noted; some hesitancy in answering questions. Psych:  Normal affect   EKG:  The EKG was personally reviewed and demonstrates: Atrial fibrillation with nonspecific ST changes. Telemetry:  Telemetry was personally reviewed and demonstrates: Atrial fibrillation with controlled ventricular response.  Relevant CV Studies: Indications:  acute diastolic chf    History:    Patient has no prior history of Echocardiogram  examinations.         CAD, Arrythmias:Atrial Fibrillation,  Signs/Symptoms:Shortness of         Breath; Risk Factors:Hypertension and Dyslipidemia.    Sonographer:  Johny Chess  Referring Phys: [phone] JARED M GARDNER     Sonographer Comments: Image acquisition challenging due to respiratory  motion.   IMPRESSIONS    1. Left ventricular ejection fraction, by estimation, is 40 to 45%. The  left ventricle has mildly decreased function. The left ventricle  demonstrates regional wall motion abnormalities. Septal hypokinesis. There  is mild left ventricular hypertrophy.  There is the interventricular septum is flattened in systole and diastole,  consistent with right ventricular pressure and volume overload. Left  ventricular diastolic parameters are indeterminate.  2. Right ventricular systolic function is moderately reduced. The right  ventricular size is moderately enlarged. There is moderately elevated  pulmonary artery systolic pressure. The estimated right ventricular  systolic pressure is 58.5 mmHg.  3. Right atrial size was severely  dilated.  4. The mitral valve is normal in structure. Trivial mitral valve  regurgitation.  5. Tricuspid valve regurgitation is moderate.  6. The aortic valve is tricuspid. Aortic valve regurgitation is trivial.  Mild to moderate aortic valve sclerosis/calcification is present, without  any evidence of aortic stenosis.  7. The inferior vena cava is normal in size with <50% respiratory  variability, suggesting right atrial pressure of 8 mmHg.   Laboratory Data:  High Sensitivity Troponin:   Recent Labs  Lab 03/21/21 2047 03/22/21 0214  TROPONINIHS 29* 31*     Chemistry Recent Labs  Lab 03/21/21 2047 03/21/21 2138 03/22/21 0214 03/23/21 0408  NA 138 142 140 141  K 5.7* 3.6 3.8 3.7  CL 106  --  109 102  CO2 24  --  22 27  GLUCOSE 108*  --  101* 130*  BUN 22  --  20 24*  CREATININE 0.96  --  0.87 0.95  CALCIUM 8.6*  --  8.7* 8.9  GFRNONAA >60  --  >60 >60  ANIONGAP 8  --  9 12    Recent Labs  Lab 03/21/21 2047  PROT 5.6*  ALBUMIN 2.9*  AST 68*  ALT 28  ALKPHOS 70  BILITOT 3.7*   Hematology Recent Labs  Lab 03/21/21 2047 03/21/21 2138 03/22/21 0214  WBC 14.3*  --  14.0*  RBC 5.23  --  5.24  HGB 16.4 16.7 16.3   HCT 49.4 49.0 49.5  MCV 94.5  --  94.5  MCH 31.4  --  31.1  MCHC 33.2  --  32.9  RDW 15.0  --  14.8  PLT 129*  --  PLATELET CLUMPS NOTED ON SMEAR, UNABLE TO ESTIMATE   BNP Recent Labs  Lab 03/21/21 2047  BNP 613.9*    DDimer No results for input(s): DDIMER in the last 168 hours.   Radiology/Studies:  DG Chest 2 View  Result Date: 03/21/2021 CLINICAL DATA:  Lethargy, generalized weakness, dyspnea on exertion, history of fibrosis EXAM: CHEST - 2 VIEW COMPARISON:  04/21/2020 FINDINGS: Frontal and lateral views of the chest demonstrate mild enlargement of the cardiac silhouette, which may be related to positioning and AP technique. Diffuse fibrotic changes are seen throughout the lungs, with extensive superimposed bilateral ground-glass airspace disease. Trace bilateral effusions. No pneumothorax. IMPRESSION: 1. Background pulmonary fibrosis, with superimposed ground-glass opacities compatible with edema or multifocal pneumonia. 2. Trace bilateral pleural effusions. Electronically Signed   By: Randa Ngo M.D.   On: 03/21/2021 21:29   ECHOCARDIOGRAM COMPLETE  Result Date: 03/22/2021    ECHOCARDIOGRAM REPORT   Patient Name:   Roger Sims Date of Exam: 03/22/2021 Medical Rec #:  [id number]     Height:       72.0 in Accession #:    [id number]    Weight:       161.0 lb Date of Birth:  18-Sep-1946     BSA:          1.943 m Patient Age:    70 years      BP:           148/104 mmHg Patient Gender: M             HR:           84 bpm. Exam Location:  Inpatient Procedure: 2D Echo Indications:    acute diastolic chf  History:        Patient has no prior history of Echocardiogram examinations.  CAD, Arrythmias:Atrial Fibrillation, Signs/Symptoms:Shortness of                 Breath; Risk Factors:Hypertension and Dyslipidemia.  Sonographer:    Johny Chess Referring Phys: [phone] JARED M GARDNER  Sonographer Comments: Image acquisition challenging due to respiratory motion. IMPRESSIONS  1.  Left ventricular ejection fraction, by estimation, is 40 to 45%. The left ventricle has mildly decreased function. The left ventricle demonstrates regional wall motion abnormalities. Septal hypokinesis. There is mild left ventricular hypertrophy. There is the interventricular septum is flattened in systole and diastole, consistent with right ventricular pressure and volume overload. Left ventricular diastolic parameters are indeterminate.  2. Right ventricular systolic function is moderately reduced. The right ventricular size is moderately enlarged. There is moderately elevated pulmonary artery systolic pressure. The estimated right ventricular systolic pressure is 18.2 mmHg.  3. Right atrial size was severely dilated.  4. The mitral valve is normal in structure. Trivial mitral valve regurgitation.  5. Tricuspid valve regurgitation is moderate.  6. The aortic valve is tricuspid. Aortic valve regurgitation is trivial. Mild to moderate aortic valve sclerosis/calcification is present, without any evidence of aortic stenosis.  7. The inferior vena cava is normal in size with <50% respiratory variability, suggesting right atrial pressure of 8 mmHg. FINDINGS  Left Ventricle: Left ventricular ejection fraction, by estimation, is 40 to 45%. The left ventricle has mildly decreased function. The left ventricle demonstrates regional wall motion abnormalities. The left ventricular internal cavity size was normal in size. There is mild left ventricular hypertrophy. The interventricular septum is flattened in systole and diastole, consistent with right ventricular pressure and volume overload. Left ventricular diastolic parameters are indeterminate. Right Ventricle: The right ventricular size is moderately enlarged. Right vetricular wall thickness was not well visualized. Right ventricular systolic function is moderately reduced. There is moderately elevated pulmonary artery systolic pressure. The tricuspid regurgitant velocity  is 3.46 m/s, and with an assumed right atrial pressure of 8 mmHg, the estimated right ventricular systolic pressure is 99.3 mmHg. Left Atrium: Left atrial size was normal in size. Right Atrium: Right atrial size was severely dilated. Pericardium: There is no evidence of pericardial effusion. Mitral Valve: The mitral valve is normal in structure. Trivial mitral valve regurgitation. Tricuspid Valve: The tricuspid valve is normal in structure. Tricuspid valve regurgitation is moderate. Aortic Valve: The aortic valve is tricuspid. Aortic valve regurgitation is trivial. Mild to moderate aortic valve sclerosis/calcification is present, without any evidence of aortic stenosis. Pulmonic Valve: The pulmonic valve was grossly normal. Pulmonic valve regurgitation is trivial. Aorta: The aortic root and ascending aorta are structurally normal, with no evidence of dilitation. Venous: The inferior vena cava is normal in size with less than 50% respiratory variability, suggesting right atrial pressure of 8 mmHg. IAS/Shunts: The interatrial septum was not well visualized.  LEFT VENTRICLE PLAX 2D LVIDd:         4.10 cm LVIDs:         3.30 cm LV PW:         1.00 cm LV IVS:        1.10 cm LVOT diam:     2.00 cm LV SV:         28 LV SV Index:   14 LVOT Area:     3.14 cm  RIGHT VENTRICLE            IVC RV Basal diam:  4.50 cm    IVC diam: 1.90 cm RV Mid diam:    4.40  cm RV S prime:     7.62 cm/s TAPSE (M-mode): 1.2 cm LEFT ATRIUM             Index       RIGHT ATRIUM           Index LA diam:        4.20 cm 2.16 cm/m  RA Area:     29.20 cm LA Vol (A2C):   46.3 ml 23.83 ml/m RA Volume:   109.00 ml 56.10 ml/m LA Vol (A4C):   60.0 ml 30.88 ml/m LA Biplane Vol: 56.8 ml 29.23 ml/m  AORTIC VALVE LVOT Vmax:   49.70 cm/s LVOT Vmean:  32.100 cm/s LVOT VTI:    0.089 m  AORTA Ao Root diam: 2.90 cm Ao Asc diam:  2.90 cm TRICUSPID VALVE TR Peak grad:   47.9 mmHg TR Vmax:        346.00 cm/s  SHUNTS Systemic VTI:  0.09 m Systemic Diam: 2.00 cm  Oswaldo Milian MD Electronically signed by Oswaldo Milian MD Signature Date/Time: 03/22/2021/8:06:18 PM    Final      Assessment and Plan:   1. Acute diastolic congestive heart failure-predominant issu for Roger Sims appears to be his underlying pulmonary fibrosis which has progressed significantly.  There may be a component of congestive heart failure.  I have personally reviewed his echocardiogram and feel that his LV function is in the low normal range (50 to 55%).  He also has RV dysfunction secondary to his underlying lung disease.  I do not think he is a good candidate for cardiac catheterization.  I also discussed this with he and his wife and they do not want aggressive procedures.  Would continue Lasix at 40 mg IV twice daily and ultimately transition to oral Lasix.  Would like to keep on the dry side (obviously patient will not tolerate edema superimposed on his underlying lung disease). 2. Pulmonary fibrosis-this is the predominant issue.  Continue steroids per pulmonary.  Prognosis appears to be poor.  I agree with palliative care. 3. Persistent atrial fibrillation-continue beta-blocker for rate control.  Continue apixaban.  CHA2DS2-VASc is 5. 4. Coronary artery disease-he is on statin therapy.  Prognosis is poor.  Agree with hospice/palliative care.   Risk Assessment/Risk Scores:     New York Heart Association (NYHA) Functional Class NYHA Class III  CHA2DS2-VASc Score = 5  This indicates a 7.2% annual risk of stroke. The patient's score is based upon: CHF History: Yes HTN History: Yes Diabetes History: No Stroke History: No Vascular Disease History: Yes Age Score: 2 Gender Score: 0       For questions or updates, please contact [place] Please consult [email or web address] for contact info under    Signed, Kirk Ruths, MD  03/23/2021 2:18 PM

## 2021-03-23 NOTE — Telephone Encounter (Signed)
Linna Hoff  He is now inpatient. Initiall though IPF flare but echo shows drop in EF in 2 months. He has s-CHF ef 45% (was > 70%). I called Trish for cath consult. If a lot of his resp failure is CHF then prognosis might not be as bad at least in the short term   Thanks  MR

## 2021-03-23 NOTE — Progress Notes (Signed)
PROGRESS NOTE    ILIAN Sims  PMV:[id number] DOB: 10-07-1946 DOA: 03/21/2021 PCP: Thomes Dinning, MD     Brief Narrative:  Roger Sims is a 75 year old male with past medical history significant for IPF, CAD, hypertension, A. fib on Eliquis.  He has been having decline in lung function, is followed by Dr. Chase Caller for IPF.  He now presented to the emergency department with deterioration in his respiratory status, weakness, oxygen sats 63% at home despite 6 L oxygen use.  Patient was unable to get up to ambulate, and wife could no longer take care of him at home.  Patient initially required high flow oxygen, had very poor prognosis.  PCCM and palliative care medicine were consulted.  New events last 24 hours / Subjective: Seems to have improved, weaned down to 6 L oxygen.  PCCM has ordered for cardiology consult to see if further cardiac work-up, reversible etiology can be found.  Assessment & Plan:   Principal Problem:   Acute on chronic respiratory failure with hypoxia (HCC) Active Problems:   IPF (idiopathic pulmonary fibrosis) (HCC)   Chronic respiratory failure with hypoxia (HCC)   Weight loss, unintentional   A-fib (HCC)   DNR (do not resuscitate)   Goals of care, counseling/discussion   Protein calorie malnutrition ([place])   WHO group 3 pulmonary arterial hypertension (HCC)   History of MI (myocardial infarction)   Hyperlipidemia   Shortness of breath   Palliative care status   Caregiver burden   Acute on chronic hypoxemic respiratory failure -Oxygen sat of 63% on 6 L O2, initially required high flow/nonrebreather mask.  Weaned down to 6 L today. -Procalcitonin negative, stop antibiotics -Respiratory viral panel, MRSA PCR, COVID negative -Appreciate PCCM  Acute systolic CHF -BNP 107.1  -Echocardiogram EF 40 to 45%, regional wall motion abnormality of left ventricle, septal hypokinesis, interventricular septum is flattened in systole and diastole, consistent with  right ventricular pressure and volume overload -Cardiology consulted -Continue IV Lasix  IPF -Continue Solu-Medrol -Appreciate PCCM  A. fib -Continue Eliquis, bystolic   DVT prophylaxis:   apixaban (ELIQUIS) tablet 5 mg  Code Status:     Code Status Orders  (From admission, onward)         Start     Ordered   03/22/21 0048  Do not attempt resuscitation (DNR)  Continuous       Question Answer Comment  In the event of cardiac or respiratory ARREST Do not call a "code blue"   In the event of cardiac or respiratory ARREST Do not perform Intubation, CPR, defibrillation or ACLS   In the event of cardiac or respiratory ARREST Use medication by any route, position, wound care, and other measures to relive pain and suffering. May use oxygen, suction and manual treatment of airway obstruction as needed for comfort.      03/22/21 0047        Code Status History    Date Active Date Inactive Code Status Order ID Comments User Context   03/22/2021 0017 03/22/2021 0047 DNR [id number]  [name], DO ED   Advance Care Planning Activity     Family Communication: Spouse at bedside Disposition Plan:  Status is: Inpatient  Remains inpatient appropriate because:Hemodynamically unstable, Unsafe d/c plan, IV treatments appropriate due to intensity of illness or inability to take PO and Inpatient level of care appropriate due to severity of illness   Dispo: The patient is from: Home  Anticipated d/c is to: To be determined              Patient currently is not medically stable to d/c.   Difficult to place patient No      Consultants:   PCCM  Palliative care medicine  Cardiology   Antimicrobials:  Anti-infectives (From admission, onward)   Start     Dose/Rate Route Frequency Ordered Stop   03/22/21 0600  ceFEPIme (MAXIPIME) 2 g in sodium chloride 0.9 % 100 mL IVPB  Status:  Discontinued        2 g 200 mL/hr over 30 Minutes Intravenous Every 8 hours 03/22/21  0315 03/23/21 0941   03/22/21 0400  azithromycin (ZITHROMAX) 500 mg in sodium chloride 0.9 % 250 mL IVPB  Status:  Discontinued        500 mg 250 mL/hr over 60 Minutes Intravenous Every 24 hours 03/22/21 0314 03/23/21 0941   03/21/21 2200  ceFEPIme (MAXIPIME) 2 g in sodium chloride 0.9 % 100 mL IVPB        2 g 200 mL/hr over 30 Minutes Intravenous  Once 03/21/21 2154 03/22/21 0039        Objective: Vitals:   03/22/21 1915 03/22/21 1943 03/23/21 0513 03/23/21 0755  BP: (!) 147/98  (!) 126/98 (!) 146/97  Pulse: 83  90 72  Resp: (!) 35  18   Temp:   98.2 F (36.8 C) (!) 97.2 F (36.2 C)  TempSrc:    Axillary  SpO2: 94%  96% 97%  Weight:  74 kg    Height:  _0  (1.803 m)      Intake/Output Summary (Last 24 hours) at 03/23/2021 1243 Last data filed at 03/23/2021 0255 Gross per 24 hour  Intake --  Output 2500 ml  Net -2500 ml   Filed Weights   03/22/21 1943  Weight: 74 kg    Examination:  General exam: Appears calm and comfortable, sleeping  Respiratory system: Respiratory effort normal. No respiratory distress. On 6L O2 Cardiovascular system: S1 & S2 heard. No murmurs. No pedal edema. Gastrointestinal system: Abdomen is nondistended, soft and nontender. Normal bowel sounds heard. Extremities: Symmetric in appearance  Skin: No rashes, lesions or ulcers on exposed skin  Data Reviewed: I have personally reviewed following labs and imaging studies  CBC: Recent Labs  Lab 03/21/21 2047 03/21/21 2138 03/22/21 0214  WBC 14.3*  --  14.0*  NEUTROABS 12.4*  --   --   HGB 16.4 16.7 16.3  HCT 49.4 49.0 49.5  MCV 94.5  --  94.5  PLT 129*  --  PLATELET CLUMPS NOTED ON SMEAR, UNABLE TO ESTIMATE   Basic Metabolic Panel: Recent Labs  Lab 03/21/21 2047 03/21/21 2138 03/22/21 0214 03/23/21 0408  NA 138 142 140 141  K 5.7* 3.6 3.8 3.7  CL 106  --  109 102  CO2 24  --  22 27  GLUCOSE 108*  --  101* 130*  BUN 22  --  20 24*  CREATININE 0.96  --  0.87 0.95  CALCIUM 8.6*   --  8.7* 8.9   GFR: Estimated Creatinine Clearance: 70.3 mL/min (by C-G formula based on SCr of 0.95 mg/dL). Liver Function Tests: Recent Labs  Lab 03/21/21 2047  AST 68*  ALT 28  ALKPHOS 70  BILITOT 3.7*  PROT 5.6*  ALBUMIN 2.9*   No results for input(s): LIPASE, AMYLASE in the last 168 hours. No results for input(s): AMMONIA in the last 168 hours. Coagulation Profile:  No results for input(s): INR, PROTIME in the last 168 hours. Cardiac Enzymes: No results for input(s): CKTOTAL, CKMB, CKMBINDEX, TROPONINI in the last 168 hours. BNP (last 3 results) No results for input(s): PROBNP in the last 8760 hours. HbA1C: No results for input(s): HGBA1C in the last 72 hours. CBG: No results for input(s): GLUCAP in the last 168 hours. Lipid Profile: No results for input(s): CHOL, HDL, LDLCALC, TRIG, CHOLHDL, LDLDIRECT in the last 72 hours. Thyroid Function Tests: No results for input(s): TSH, T4TOTAL, FREET4, T3FREE, THYROIDAB in the last 72 hours. Anemia Panel: No results for input(s): VITAMINB12, FOLATE, FERRITIN, TIBC, IRON, RETICCTPCT in the last 72 hours. Sepsis Labs: Recent Labs  Lab 03/22/21 0214 03/23/21 0408  PROCALCITON <0.10 <0.10    Recent Results (from the past 240 hour(s))  Resp Panel by RT-PCR (Flu A&B, Covid) Nasopharyngeal Swab     Status: None   Collection Time: 03/21/21 11:41 PM   Specimen: Nasopharyngeal Swab; Nasopharyngeal(NP) swabs in vial transport medium  Result Value Ref Range Status   SARS Coronavirus 2 by RT PCR NEGATIVE NEGATIVE Final    Comment: (NOTE) SARS-CoV-2 target nucleic acids are NOT DETECTED.  The SARS-CoV-2 RNA is generally detectable in upper respiratory specimens during the acute phase of infection. The lowest concentration of SARS-CoV-2 viral copies this assay can detect is 138 copies/mL. A negative result does not preclude SARS-Cov-2 infection and should not be used as the sole basis for treatment or other patient management  decisions. A negative result may occur with  improper specimen collection/handling, submission of specimen other than nasopharyngeal swab, presence of viral mutation(s) within the areas targeted by this assay, and inadequate number of viral copies(<138 copies/mL). A negative result must be combined with clinical observations, patient history, and epidemiological information. The expected result is Negative.  Fact Sheet for Patients:  EntrepreneurPulse.com.au  Fact Sheet for Healthcare Providers:  IncredibleEmployment.be  This test is no t yet approved or cleared by the [place] FDA and  has been authorized for detection and/or diagnosis of SARS-CoV-2 by FDA under an Emergency Use Authorization (EUA). This EUA will remain  in effect (meaning this test can be used) for the duration of the COVID-19 declaration under Section 564(b)(1) of the Act, 21 U.S.C.section 360bbb-3(b)(1), unless the authorization is terminated  or revoked sooner.       Influenza A by PCR NEGATIVE NEGATIVE Final   Influenza B by PCR NEGATIVE NEGATIVE Final    Comment: (NOTE) The Xpert Xpress SARS-CoV-2/FLU/RSV plus assay is intended as an aid in the diagnosis of influenza from Nasopharyngeal swab specimens and should not be used as a sole basis for treatment. Nasal washings and aspirates are unacceptable for Xpert Xpress SARS-CoV-2/FLU/RSV testing.  Fact Sheet for Patients: EntrepreneurPulse.com.au  Fact Sheet for Healthcare Providers: IncredibleEmployment.be  This test is not yet approved or cleared by the [place] FDA and has been authorized for detection and/or diagnosis of SARS-CoV-2 by FDA under an Emergency Use Authorization (EUA). This EUA will remain in effect (meaning this test can be used) for the duration of the COVID-19 declaration under Section 564(b)(1) of the Act, 21 U.S.C. section 360bbb-3(b)(1), unless the  authorization is terminated or revoked.  Performed at [facility], [place] [address] [place], [place] [address]   MRSA PCR Screening     Status: None   Collection Time: 03/22/21  4:14 AM   Specimen: Nasopharyngeal  Result Value Ref Range Status   MRSA by PCR NEGATIVE NEGATIVE Final  Comment:        The GeneXpert MRSA Assay (FDA approved for NASAL specimens only), is one component of a comprehensive MRSA colonization surveillance program. It is not intended to diagnose MRSA infection nor to guide or monitor treatment for MRSA infections. Performed at [facility], [place] [address], [place], [place] [address]   Respiratory (~20 pathogens) panel by PCR     Status: None   Collection Time: 03/22/21 10:50 AM   Specimen: Nasopharyngeal Swab; Respiratory  Result Value Ref Range Status   Adenovirus NOT DETECTED NOT DETECTED Final   Coronavirus 229E NOT DETECTED NOT DETECTED Final    Comment: (NOTE) The Coronavirus on the Respiratory Panel, DOES NOT test for the novel  Coronavirus (2019 nCoV)    Coronavirus HKU1 NOT DETECTED NOT DETECTED Final   Coronavirus NL63 NOT DETECTED NOT DETECTED Final   Coronavirus OC43 NOT DETECTED NOT DETECTED Final   Metapneumovirus NOT DETECTED NOT DETECTED Final   Rhinovirus / Enterovirus NOT DETECTED NOT DETECTED Final   Influenza A NOT DETECTED NOT DETECTED Final   Influenza B NOT DETECTED NOT DETECTED Final   Parainfluenza Virus 1 NOT DETECTED NOT DETECTED Final   Parainfluenza Virus 2 NOT DETECTED NOT DETECTED Final   Parainfluenza Virus 3 NOT DETECTED NOT DETECTED Final   Parainfluenza Virus 4 NOT DETECTED NOT DETECTED Final   Respiratory Syncytial Virus NOT DETECTED NOT DETECTED Final   Bordetella pertussis NOT DETECTED NOT DETECTED Final   Bordetella Parapertussis NOT DETECTED NOT DETECTED Final   Chlamydophila pneumoniae NOT DETECTED NOT DETECTED Final   Mycoplasma pneumoniae NOT DETECTED NOT DETECTED Final    Comment: Performed  at [facility], [place]. [address] [place], [place] [address]      Radiology Studies: DG Chest 2 View  Result Date: 03/21/2021 CLINICAL DATA:  Lethargy, generalized weakness, dyspnea on exertion, history of fibrosis EXAM: CHEST - 2 VIEW COMPARISON:  04/21/2020 FINDINGS: Frontal and lateral views of the chest demonstrate mild enlargement of the cardiac silhouette, which may be related to positioning and AP technique. Diffuse fibrotic changes are seen throughout the lungs, with extensive superimposed bilateral ground-glass airspace disease. Trace bilateral effusions. No pneumothorax. IMPRESSION: 1. Background pulmonary fibrosis, with superimposed ground-glass opacities compatible with edema or multifocal pneumonia. 2. Trace bilateral pleural effusions. Electronically Signed   By: Randa Ngo M.D.   On: 03/21/2021 21:29   ECHOCARDIOGRAM COMPLETE  Result Date: 03/22/2021    ECHOCARDIOGRAM REPORT   Patient Name:   Roger Sims Date of Exam: 03/22/2021 Medical Rec #:  [id number]     Height:       72.0 in Accession #:    [id number]    Weight:       161.0 lb Date of Birth:  [birth year]     BSA:          1.943 m Patient Age:    81 years      BP:           148/104 mmHg Patient Gender: M             HR:           84 bpm. Exam Location:  Inpatient Procedure: 2D Echo Indications:    acute diastolic chf  History:        Patient has no prior history of Echocardiogram examinations.                 CAD, Arrythmias:Atrial Fibrillation, Signs/Symptoms:Shortness of  Breath; Risk Factors:Hypertension and Dyslipidemia.  Sonographer:    Johny Chess Referring Phys: [phone] JARED M GARDNER  Sonographer Comments: Image acquisition challenging due to respiratory motion. IMPRESSIONS  1. Left ventricular ejection fraction, by estimation, is 40 to 45%. The left ventricle has mildly decreased function. The left ventricle demonstrates regional wall motion abnormalities. Septal hypokinesis. There is mild left  ventricular hypertrophy. There is the interventricular septum is flattened in systole and diastole, consistent with right ventricular pressure and volume overload. Left ventricular diastolic parameters are indeterminate.  2. Right ventricular systolic function is moderately reduced. The right ventricular size is moderately enlarged. There is moderately elevated pulmonary artery systolic pressure. The estimated right ventricular systolic pressure is 59.2 mmHg.  3. Right atrial size was severely dilated.  4. The mitral valve is normal in structure. Trivial mitral valve regurgitation.  5. Tricuspid valve regurgitation is moderate.  6. The aortic valve is tricuspid. Aortic valve regurgitation is trivial. Mild to moderate aortic valve sclerosis/calcification is present, without any evidence of aortic stenosis.  7. The inferior vena cava is normal in size with <50% respiratory variability, suggesting right atrial pressure of 8 mmHg. FINDINGS  Left Ventricle: Left ventricular ejection fraction, by estimation, is 40 to 45%. The left ventricle has mildly decreased function. The left ventricle demonstrates regional wall motion abnormalities. The left ventricular internal cavity size was normal in size. There is mild left ventricular hypertrophy. The interventricular septum is flattened in systole and diastole, consistent with right ventricular pressure and volume overload. Left ventricular diastolic parameters are indeterminate. Right Ventricle: The right ventricular size is moderately enlarged. Right vetricular wall thickness was not well visualized. Right ventricular systolic function is moderately reduced. There is moderately elevated pulmonary artery systolic pressure. The tricuspid regurgitant velocity is 3.46 m/s, and with an assumed right atrial pressure of 8 mmHg, the estimated right ventricular systolic pressure is 92.4 mmHg. Left Atrium: Left atrial size was normal in size. Right Atrium: Right atrial size was  severely dilated. Pericardium: There is no evidence of pericardial effusion. Mitral Valve: The mitral valve is normal in structure. Trivial mitral valve regurgitation. Tricuspid Valve: The tricuspid valve is normal in structure. Tricuspid valve regurgitation is moderate. Aortic Valve: The aortic valve is tricuspid. Aortic valve regurgitation is trivial. Mild to moderate aortic valve sclerosis/calcification is present, without any evidence of aortic stenosis. Pulmonic Valve: The pulmonic valve was grossly normal. Pulmonic valve regurgitation is trivial. Aorta: The aortic root and ascending aorta are structurally normal, with no evidence of dilitation. Venous: The inferior vena cava is normal in size with less than 50% respiratory variability, suggesting right atrial pressure of 8 mmHg. IAS/Shunts: The interatrial septum was not well visualized.  LEFT VENTRICLE PLAX 2D LVIDd:         4.10 cm LVIDs:         3.30 cm LV PW:         1.00 cm LV IVS:        1.10 cm LVOT diam:     2.00 cm LV SV:         28 LV SV Index:   14 LVOT Area:     3.14 cm  RIGHT VENTRICLE            IVC RV Basal diam:  4.50 cm    IVC diam: 1.90 cm RV Mid diam:    4.40 cm RV S prime:     7.62 cm/s TAPSE (M-mode): 1.2 cm LEFT ATRIUM  Index       RIGHT ATRIUM           Index LA diam:        4.20 cm 2.16 cm/m  RA Area:     29.20 cm LA Vol (A2C):   46.3 ml 23.83 ml/m RA Volume:   109.00 ml 56.10 ml/m LA Vol (A4C):   60.0 ml 30.88 ml/m LA Biplane Vol: 56.8 ml 29.23 ml/m  AORTIC VALVE LVOT Vmax:   49.70 cm/s LVOT Vmean:  32.100 cm/s LVOT VTI:    0.089 m  AORTA Ao Root diam: 2.90 cm Ao Asc diam:  2.90 cm TRICUSPID VALVE TR Peak grad:   47.9 mmHg TR Vmax:        346.00 cm/s  SHUNTS Systemic VTI:  0.09 m Systemic Diam: 2.00 cm Oswaldo Milian MD Electronically signed by Oswaldo Milian MD Signature Date/Time: 03/22/2021/8:06:18 PM    Final       Scheduled Meds: . apixaban  5 mg Oral BID  . furosemide  40 mg Intravenous BID  .  latanoprost  1 drop Both Eyes QHS  . methylPREDNISolone (SOLU-MEDROL) injection  125 mg Intravenous Q6H  . nebivolol  10 mg Oral Daily  . rosuvastatin  20 mg Oral Daily   Continuous Infusions: . dextrose 5% lactated ringers 10 mL/hr at 03/23/21 0456     LOS: 1 day      Time spent: 35 minutes   Dessa Phi, DO [facility] 03/23/2021, 12:43 PM   Available via Epic secure chat 7am-7pm After these hours, please refer to coverage provider listed on [email or web address]

## 2021-03-23 NOTE — Consult Note (Signed)
NAME:  Roger Sims, MRN:  [id number], DOB:  08-26-1946, LOS: 1 ADMISSION DATE:  03/21/2021, CONSULTATION DATE:  03/22/21 REFERRING MD:  Alcario Drought CHIEF COMPLAINT:  Hypoxic Respiratory Failure   briefs:  Roger Sims is a 75 y.o. male who has a PMH including but not limited to IPF.  He is followed by Dr. Chase Caller in the office and was last seen on 03/16/2021.  At the time, Dr. Chase Caller felt that he had had significant decline since prior visit and had a very poor prognosis.  Hospice was discussed and recommended; however, patient and wife preferred home palliation versus hospice.  They also discussed inhaled treprostinil and they were interested in possibly considering this.  Dr. Chase Caller sent a note to Dr. Haroldine Laws and Aundra Dubin regarding possibly getting a right heart cath since he would need this prior to any consideration of inhaled treprostinil.  The time was to improve quality of life and the plan was that if he continued to decline then they would need to put more emphasis on considering home hospice.  Unfortunately on 5/30, he had acute decline.  O2 saturations dropped down into the 60s.  Wife called the on-call pager and was instructed to take him to the ED as she was not able to manage.  In the ED, he was admitted by the hospitalist team for worsening hypoxic respiratory failure and possible underlying pneumonia and/or pulmonary edema.Marland Kitchen  He was started on Lasix as well as empiric cefepime and azithromycin.  PCCM was asked to see him for further recommendations.   Pertinent  Medical History:  has IPF (idiopathic pulmonary fibrosis) ([place]); Pulmonary emphysema ([place]); Chronic respiratory failure with hypoxia ([place]); Weight loss, unintentional; Healthcare maintenance; Acute on chronic respiratory failure with hypoxia ([place]); A-fib [facility]); DNR (do not resuscitate); Goals of care, counseling/discussion; Protein calorie malnutrition ([place]); WHO group 3 pulmonary arterial hypertension ([place]); History of MI  (myocardial infarction); Hyperlipidemia; Shortness of breath; Palliative care status; and Caregiver burden on their problem list.  Significant Hospital Events: Including procedures, antibiotic start and stop dates in addition to other pertinent events   5/30 > admit. 5/31 > PCCM consult. Comfortable on 15L NRB.  Sats 98%.  Can likely drop O2 support some. Feels breathing slightly better.  Micro Data: Flu 5/30 > neg. SARS CoV2 5/30 > neg. RV 5/31 - neg Urine strep 5/21 - neg PCT < 0.1  Antibiotics: Cefepime 5/30 > 6/1 Azithromycin 5/30 > 6/1  Interim History / Subjective:   6/1- admitted with top concern of terminal flare with life expectancy in order of days to weeks due to severe hypoxemia but with goals of concurrent palliation and basic medical care to look for reversible etiologies. New onset edema noted in ER (echo 01/25/21 at [facility] with  High prob for group 3 PAH and normal EF 70%). Echo here with low EF 45% - new finding. Is currently  On lasix and IV steroids for IPF flare.   Noted prior to this admit - family wanted to delay hospice till mother in law funeral (6/12) complete + he moves to a one story apartment (mid June 2022) and possible RHC (dr Jerilee Field Aundra Dubin) was being set up  This mornign wife feels he is better. Down to [facility]   Objective:  Blood pressure (!) 146/97, pulse 72, temperature (!) 97.2 F (36.2 C), temperature source Axillary, resp. rate 18, height _0  (1.803 m), weight 74 kg, SpO2 97 %.        Intake/Output Summary (Last  24 hours) at 03/23/2021 0927 Last data filed at 03/23/2021 0255 Gross per 24 hour  Intake --  Output 2500 ml  Net -2500 ml   Filed Weights   03/22/21 1943  Weight: 74 kg    Examination: General: No distress. Looks more rested and better Neuro: Alert and Oriented x 3. GCS 15. Speech normal Psych: Pleasant Resp:  Barrel Chest - no.  Wheeze - no, Crackles - yes at base, No overt respiratory distress CVS: Normal heart sounds.  Murmurs - no Ext: Stigmata of Connective Tissue Disease - no HEENT: Normal upper airway. PEERL +. No post nasal drip. Carlean Jews [place]     LABS   Results for Roger Sims, Roger Sims (MRN [id number]) as of 03/23/2021 09:58  Ref. Range 03/21/2021 20:47 03/22/2021 02:14  Troponin I (High Sensitivity) Latest Ref Range: <18 ng/L 29 (H) 31 (H)   PULMONARY Recent Labs  Lab 03/21/21 2138  PHART 7.484*  PCO2ART 31.5*  PO2ART 91  HCO3 23.8  TCO2 25  O2SAT 98.0    CBC Recent Labs  Lab 03/21/21 2047 03/21/21 2138 03/22/21 0214  HGB 16.4 16.7 16.3  HCT 49.4 49.0 49.5  WBC 14.3*  --  14.0*  PLT 129*  --  PLATELET CLUMPS NOTED ON SMEAR, UNABLE TO ESTIMATE    COAGULATION No results for input(s): INR in the last 168 hours.  CARDIAC  No results for input(s): TROPONINI in the last 168 hours. No results for input(s): PROBNP in the last 168 hours.   CHEMISTRY Recent Labs  Lab 03/21/21 2047 03/21/21 2138 03/22/21 0214 03/23/21 0408  NA 138 142 140 141  K 5.7* 3.6 3.8 3.7  CL 106  --  109 102  CO2 24  --  22 27  GLUCOSE 108*  --  101* 130*  BUN 22  --  20 24*  CREATININE 0.96  --  0.87 0.95  CALCIUM 8.6*  --  8.7* 8.9   Estimated Creatinine Clearance: 70.3 mL/min (by C-G formula based on SCr of 0.95 mg/dL).   LIVER Recent Labs  Lab 03/21/21 2047  AST 68*  ALT 28  ALKPHOS 70  BILITOT 3.7*  PROT 5.6*  ALBUMIN 2.9*     INFECTIOUS Recent Labs  Lab 03/22/21 0214 03/23/21 0408  PROCALCITON <0.10 <0.10     ENDOCRINE CBG (last 3)  No results for input(s): GLUCAP in the last 72 hours.       IMAGING x48h  - image(s) personally visualized  -   highlighted in bold DG Chest 2 View  Result Date: 03/21/2021 CLINICAL DATA:  Lethargy, generalized weakness, dyspnea on exertion, history of fibrosis EXAM: CHEST - 2 VIEW COMPARISON:  04/21/2020 FINDINGS: Frontal and lateral views of the chest demonstrate mild enlargement of the cardiac silhouette, which may be related to positioning and  AP technique. Diffuse fibrotic changes are seen throughout the lungs, with extensive superimposed bilateral ground-glass airspace disease. Trace bilateral effusions. No pneumothorax. IMPRESSION: 1. Background pulmonary fibrosis, with superimposed ground-glass opacities compatible with edema or multifocal pneumonia. 2. Trace bilateral pleural effusions. Electronically Signed   By: Randa Ngo M.D.   On: 03/21/2021 21:29   ECHOCARDIOGRAM COMPLETE  Result Date: 03/22/2021    ECHOCARDIOGRAM REPORT   Patient Name:   Roger Sims Date of Exam: 03/22/2021 Medical Rec #:  [id number]     Height:       72.0 in Accession #:    [id number]    Weight:       161.0 lb  Date of Birth:  20-May-1946     BSA:          1.943 m Patient Age:    49 years      BP:           148/104 mmHg Patient Gender: M             HR:           84 bpm. Exam Location:  Inpatient Procedure: 2D Echo Indications:    acute diastolic chf  History:        Patient has no prior history of Echocardiogram examinations.                 CAD, Arrythmias:Atrial Fibrillation, Signs/Symptoms:Shortness of                 Breath; Risk Factors:Hypertension and Dyslipidemia.  Sonographer:    Johny Chess Referring Phys: [phone] JARED M GARDNER  Sonographer Comments: Image acquisition challenging due to respiratory motion. IMPRESSIONS  1. Left ventricular ejection fraction, by estimation, is 40 to 45%. The left ventricle has mildly decreased function. The left ventricle demonstrates regional wall motion abnormalities. Septal hypokinesis. There is mild left ventricular hypertrophy. There is the interventricular septum is flattened in systole and diastole, consistent with right ventricular pressure and volume overload. Left ventricular diastolic parameters are indeterminate.  2. Right ventricular systolic function is moderately reduced. The right ventricular size is moderately enlarged. There is moderately elevated pulmonary artery systolic pressure. The estimated right  ventricular systolic pressure is 66.2 mmHg.  3. Right atrial size was severely dilated.  4. The mitral valve is normal in structure. Trivial mitral valve regurgitation.  5. Tricuspid valve regurgitation is moderate.  6. The aortic valve is tricuspid. Aortic valve regurgitation is trivial. Mild to moderate aortic valve sclerosis/calcification is present, without any evidence of aortic stenosis.  7. The inferior vena cava is normal in size with <50% respiratory variability, suggesting right atrial pressure of 8 mmHg. FINDINGS  Left Ventricle: Left ventricular ejection fraction, by estimation, is 40 to 45%. The left ventricle has mildly decreased function. The left ventricle demonstrates regional wall motion abnormalities. The left ventricular internal cavity size was normal in size. There is mild left ventricular hypertrophy. The interventricular septum is flattened in systole and diastole, consistent with right ventricular pressure and volume overload. Left ventricular diastolic parameters are indeterminate. Right Ventricle: The right ventricular size is moderately enlarged. Right vetricular wall thickness was not well visualized. Right ventricular systolic function is moderately reduced. There is moderately elevated pulmonary artery systolic pressure. The tricuspid regurgitant velocity is 3.46 m/s, and with an assumed right atrial pressure of 8 mmHg, the estimated right ventricular systolic pressure is 94.7 mmHg. Left Atrium: Left atrial size was normal in size. Right Atrium: Right atrial size was severely dilated. Pericardium: There is no evidence of pericardial effusion. Mitral Valve: The mitral valve is normal in structure. Trivial mitral valve regurgitation. Tricuspid Valve: The tricuspid valve is normal in structure. Tricuspid valve regurgitation is moderate. Aortic Valve: The aortic valve is tricuspid. Aortic valve regurgitation is trivial. Mild to moderate aortic valve sclerosis/calcification is present,  without any evidence of aortic stenosis. Pulmonic Valve: The pulmonic valve was grossly normal. Pulmonic valve regurgitation is trivial. Aorta: The aortic root and ascending aorta are structurally normal, with no evidence of dilitation. Venous: The inferior vena cava is normal in size with less than 50% respiratory variability, suggesting right atrial pressure of 8 mmHg. IAS/Shunts: The interatrial septum was  not well visualized.  LEFT VENTRICLE PLAX 2D LVIDd:         4.10 cm LVIDs:         3.30 cm LV PW:         1.00 cm LV IVS:        1.10 cm LVOT diam:     2.00 cm LV SV:         28 LV SV Index:   14 LVOT Area:     3.14 cm  RIGHT VENTRICLE            IVC RV Basal diam:  4.50 cm    IVC diam: 1.90 cm RV Mid diam:    4.40 cm RV S prime:     7.62 cm/s TAPSE (M-mode): 1.2 cm LEFT ATRIUM             Index       RIGHT ATRIUM           Index LA diam:        4.20 cm 2.16 cm/m  RA Area:     29.20 cm LA Vol (A2C):   46.3 ml 23.83 ml/m RA Volume:   109.00 ml 56.10 ml/m LA Vol (A4C):   60.0 ml 30.88 ml/m LA Biplane Vol: 56.8 ml 29.23 ml/m  AORTIC VALVE LVOT Vmax:   49.70 cm/s LVOT Vmean:  32.100 cm/s LVOT VTI:    0.089 m  AORTA Ao Root diam: 2.90 cm Ao Asc diam:  2.90 cm TRICUSPID VALVE TR Peak grad:   47.9 mmHg TR Vmax:        346.00 cm/s  SHUNTS Systemic VTI:  0.09 m Systemic Diam: 2.00 cm Oswaldo Milian MD Electronically signed by Oswaldo Milian MD Signature Date/Time: 03/22/2021/8:06:18 PM    Final      PROBLEM LIST      Patient Active Problem List   Diagnosis Date Noted  . DNR (do not resuscitate) 03/22/2021    Priority: High  . Palliative care status 03/22/2021    Priority: High  . Acute on chronic respiratory failure with hypoxia ([place]) 03/21/2021    Priority: High  . Protein calorie malnutrition ([place]) 03/21/2021    Priority: High  . WHO group 3 pulmonary arterial hypertension ([place]) 02/14/2021    Priority: High    Class: Chronic  . IPF (idiopathic pulmonary fibrosis) ([place])  09/15/2020    Priority: High  . Chronic respiratory failure with hypoxia (HCC) 09/15/2020    Priority: High  . A-fib ([place]) 03/22/2021    Priority: Medium    Class: Chronic  . Caregiver burden 03/22/2021    Priority: Medium    Class: Acute  . Pulmonary emphysema ([place]) 09/15/2020    Priority: Medium  . Weight loss, unintentional 09/15/2020    Priority: Medium  . Goals of care, counseling/discussion 03/22/2021    Priority: Low  . Hyperlipidemia     Priority: Low    Class: Chronic  . History of MI (myocardial infarction) 03/22/2010    Priority: Low    Class: Chronic  . Shortness of breath 03/22/2021  . Healthcare maintenance 09/15/2020     Assessment & Plan:   Acute on chronic hypoxic respiratory failure   In setting of IPF and clinical (echo 4/5)based WHO group 3 Pulm htn   - sever worsneing at admit 03/21/2021  - Infectious workup negative - Course: improved 03/23/21 with lasix and IV steroids - Ddx:  - seems to have new onset systolic acute CHF (change in echo  x 2 months). IPF flare (much pooer prognosis) is still leading ddx  Plan (d/w wife and TRH MD Dr Maylene Roes)  - readjust goals - appears we have 1 potential reversible etiology systolic CHF. IF this is true and if cards can deterimine cause/Rx prognosis might not be terminal of days to weeks as thought as  Yesterday. Possibly needs RHC +/- LHC This admit  - continue lasix and steroids  - dc antibiotics  - cards consult - Trish cards master called   - heart health diet ok       SIGNATURE    Dr. Brand Males, M.D., F.C.C.P,  Pulmonary and Critical Care Medicine Staff Physician, [place] Director - Interstitial Lung Disease  Program  Pulmonary [place] at [place], [place], [address]  Pager: [phone], If no answer  OR between  19:00-7:00h: page [phone] Telephone (clinical office): [phone] Telephone (research): [phone]  10:03 AM 03/23/2021

## 2021-03-24 DIAGNOSIS — J189 Pneumonia, unspecified organism: Secondary | ICD-10-CM

## 2021-03-24 DIAGNOSIS — I5031 Acute diastolic (congestive) heart failure: Secondary | ICD-10-CM

## 2021-03-24 DIAGNOSIS — J9621 Acute and chronic respiratory failure with hypoxia: Secondary | ICD-10-CM | POA: Diagnosis not present

## 2021-03-24 MED ORDER — HALOPERIDOL 1 MG PO TABS: 0.5000 mg | ORAL_TABLET | ORAL | Status: DC | PRN

## 2021-03-24 MED ORDER — MORPHINE SULFATE (PF) 2 MG/ML IV SOLN: 2.0000 mg | INTRAVENOUS | Status: DC | PRN

## 2021-03-24 MED ORDER — LORAZEPAM 2 MG/ML IJ SOLN: 1.0000 mg | INTRAMUSCULAR | Status: DC | PRN

## 2021-03-24 MED ORDER — SODIUM CHLORIDE 0.9% FLUSH
3.0000 mL | Freq: Two times a day (BID) | INTRAVENOUS | Status: DC
  Administered 2021-03-24 – 2021-03-27 (×6): 3 mL via INTRAVENOUS

## 2021-03-24 MED ORDER — SODIUM CHLORIDE 0.9% FLUSH: 3.0000 mL | INTRAVENOUS | Status: DC | PRN

## 2021-03-24 MED ORDER — HALOPERIDOL LACTATE 2 MG/ML PO CONC
0.5000 mg | ORAL | Status: DC | PRN
  Filled 2021-03-24: qty 0.3

## 2021-03-24 MED ORDER — SODIUM CHLORIDE 0.9 % IV SOLN: 250.0000 mL | INTRAVENOUS | Status: DC | PRN

## 2021-03-24 MED ORDER — FUROSEMIDE 40 MG PO TABS
40.0000 mg | ORAL_TABLET | Freq: Every day | ORAL | Status: DC
  Administered 2021-03-25 – 2021-03-26 (×2): 40 mg via ORAL
  Filled 2021-03-24 (×2): qty 1

## 2021-03-24 MED ORDER — PREDNISONE 20 MG PO TABS
50.0000 mg | ORAL_TABLET | Freq: Every day | ORAL | Status: DC
  Administered 2021-03-25 – 2021-03-26 (×2): 50 mg via ORAL
  Filled 2021-03-24 (×3): qty 2

## 2021-03-24 MED ORDER — HALOPERIDOL LACTATE 5 MG/ML IJ SOLN
0.5000 mg | INTRAMUSCULAR | Status: DC | PRN
  Administered 2021-03-26: 0.5 mg via INTRAVENOUS
  Filled 2021-03-24: qty 1

## 2021-03-24 NOTE — TOC Initial Note (Signed)
Transition of Care [facility]) - Initial/Assessment Note    Patient Details  Name: Roger Sims MRN: [id number] Date of Birth: 16-Mar-1946  Transition of Care [facility]) CM/SW Contact:    Joanne Chars, LCSW Phone Number: 03/24/2021, 1:55 PM  Clinical Narrative:  CSW met with pt wife to discuss residential hospice.  Wife confirms this choice, choice document given, wife would like to see if residential bed available at [facility].  Pt has had covid vaccine but not booster.    CSW spoke with Cherie at [facility], she will make contact with family.  No residential beds today.                   Expected Discharge Plan: [place] Barriers to Discharge: Continued Medical Work up,Other (must enter comment) (pending hospice evaluation)   Patient Goals and CMS Choice Patient states their goals for this hospitalization and ongoing recovery are:: comfort CMS Medicare.gov Compare Post Acute Care list provided to:: Patient Represenative (must comment) Choice offered to / list presented to : Spouse  Expected Discharge Plan and Services Expected Discharge Plan: [place] In-house Referral: Clinical Social Work   Post Acute Care Choice: Hospice Living arrangements for the past 2 months: Agency:  (Residential Hospice) [place] Date [place]: 03/24/21 Time [place]: 1354 Representative spoke with at [place]: [place]  Prior Living Arrangements/Services Living arrangements for the past 2 months: [place] with:: Spouse Patient language and need for interpreter reviewed:: No        Need for Family Participation in Patient Care: Yes (Comment) Care giver support system in place?: Yes (comment) Current home services: Other (comment) (none) Criminal Activity/Legal Involvement Pertinent to Current  Situation/Hospitalization: No - Comment as needed  Activities of Daily Living      Permission Sought/Granted                  Emotional Assessment Appearance:: Appears stated age Attitude/Demeanor/Rapport: Unable to Assess Affect (typically observed): Unable to Assess Orientation: : Oriented to Self,Oriented to Place,Oriented to  Time,Oriented to Situation Alcohol / Substance Use: Not Applicable Psych Involvement: No (comment)  Admission diagnosis:  Acute on chronic respiratory failure with hypoxia (HCC) [J96.21] Shortness of breath [R06.02] Patient Active Problem List   Diagnosis Date Noted  . A-fib ([place]) 03/22/2021    Class: Chronic  . DNR (do not resuscitate) 03/22/2021  . Goals of care, counseling/discussion 03/22/2021  . Shortness of breath 03/22/2021  . Palliative care status 03/22/2021  . Caregiver burden 03/22/2021    Class: Acute  . Hyperlipidemia     Class: Chronic  . Acute on chronic respiratory failure with hypoxia ([place]) 03/21/2021  . Protein calorie malnutrition ([place]) 03/21/2021  . WHO group 3 pulmonary arterial hypertension ([place]) 02/14/2021    Class: Chronic  . IPF (idiopathic pulmonary fibrosis) ([place]) 09/15/2020  . Pulmonary emphysema ([place]) 09/15/2020  . Chronic respiratory failure with hypoxia ([place]) 09/15/2020  . Weight loss, unintentional 09/15/2020  . Healthcare maintenance 09/15/2020  . History of MI (myocardial infarction) 03/22/2010    Class: Chronic   PCP:  Thomes Dinning, MD Pharmacy:   [place], [place] - [address] [place] [address]  Phone: [phone] Fax: [place], [place] [address] Phone: [phone] Fax: [phone]     Social Determinants of Health (SDOH) Interventions    Readmission Risk Interventions No flowsheet data found.

## 2021-03-24 NOTE — Progress Notes (Signed)
Progress Note  Patient Name: Roger Sims Date of Encounter: 03/24/2021  [facility] Cardiologist: New Dr Stanford Breed  Subjective   Dyspneic; no CP  Inpatient Medications    Scheduled Meds: . apixaban  5 mg Oral BID  . furosemide  40 mg Intravenous BID  . latanoprost  1 drop Both Eyes QHS  . methylPREDNISolone (SOLU-MEDROL) injection  125 mg Intravenous Q6H  . nebivolol  10 mg Oral Daily  . rosuvastatin  20 mg Oral Daily   Continuous Infusions: . dextrose 5% lactated ringers 10 mL/hr at 03/23/21 0456   PRN Meds: acetaminophen **OR** acetaminophen, ALPRAZolam, midazolam, morphine injection, ondansetron **OR** ondansetron (ZOFRAN) IV   Vital Signs    Vitals:   03/23/21 0513 03/23/21 0755 03/23/21 2133 03/24/21 0533  BP: (!) 126/98 (!) 146/97 (!) 134/93 113/69  Pulse: 90 72 69 68  Resp: _0 Temp: 98.2 F (36.8 C) (!) 97.2 F (36.2 C) 98.1 F (36.7 C) 98.2 F (36.8 C)  TempSrc:  Axillary    SpO2: 96% 97% 93% 90%  Weight:      Height:        Intake/Output Summary (Last 24 hours) at 03/24/2021 1056 Last data filed at 03/24/2021 0416 Gross per 24 hour  Intake 746.3 ml  Output 2225 ml  Net -1478.7 ml   Last 3 Weights 03/22/2021 03/16/2021 01/13/2021  Weight (lbs) 163 lb 2.3 oz 161 lb 161 lb  Weight (kg) 74 kg 73.029 kg 73.029 kg      Telemetry    Atrial fibrillation, rate controlled- Personally Reviewed   Physical Exam   GEN: No acute distress.  Chronically ill appearing Neck: No JVD Cardiac: irregular Respiratory: Basilar dry crackles GI: Soft, nontender, non-distended  MS: 1+ ankle edema Neuro:  Nonfocal  Psych: Normal affect   Labs    High Sensitivity Troponin:   Recent Labs  Lab 03/21/21 2047 03/22/21 0214  TROPONINIHS 29* 31*      Chemistry Recent Labs  Lab 03/21/21 2047 03/21/21 2138 03/22/21 0214 03/23/21 0408  NA 138 142 140 141  K 5.7* 3.6 3.8 3.7  CL 106  --  109 102  CO2 24  --  22 27  GLUCOSE 108*  --  101* 130*   BUN 22  --  20 24*  CREATININE 0.96  --  0.87 0.95  CALCIUM 8.6*  --  8.7* 8.9  PROT 5.6*  --   --   --   ALBUMIN 2.9*  --   --   --   AST 68*  --   --   --   ALT 28  --   --   --   ALKPHOS 70  --   --   --   BILITOT 3.7*  --   --   --   GFRNONAA >60  --  >60 >60  ANIONGAP 8  --  9 12     Hematology Recent Labs  Lab 03/21/21 2047 03/21/21 2138 03/22/21 0214  WBC 14.3*  --  14.0*  RBC 5.23  --  5.24  HGB 16.4 16.7 16.3  HCT 49.4 49.0 49.5  MCV 94.5  --  94.5  MCH 31.4  --  31.1  MCHC 33.2  --  32.9  RDW 15.0  --  14.8  PLT 129*  --  PLATELET CLUMPS NOTED ON SMEAR, UNABLE TO ESTIMATE    BNP Recent Labs  Lab 03/21/21 2047  BNP 613.9*  Radiology    ECHOCARDIOGRAM COMPLETE  Result Date: 03/22/2021    ECHOCARDIOGRAM REPORT   Patient Name:   Roger Sims Date of Exam: 03/22/2021 Medical Rec #:  [id number]     Height:       72.0 in Accession #:    [id number]    Weight:       161.0 lb Date of Birth:  [birth year]     BSA:          1.943 m Patient Age:    75 years      BP:           148/104 mmHg Patient Gender: M             HR:           84 bpm. Exam Location:  Inpatient Procedure: 2D Echo Indications:    acute diastolic chf  History:        Patient has no prior history of Echocardiogram examinations.                 CAD, Arrythmias:Atrial Fibrillation, Signs/Symptoms:Shortness of                 Breath; Risk Factors:Hypertension and Dyslipidemia.  Sonographer:    Johny Chess Referring Phys: [phone] JARED M GARDNER  Sonographer Comments: Image acquisition challenging due to respiratory motion. IMPRESSIONS  1. Left ventricular ejection fraction, by estimation, is 40 to 45%. The left ventricle has mildly decreased function. The left ventricle demonstrates regional wall motion abnormalities. Septal hypokinesis. There is mild left ventricular hypertrophy. There is the interventricular septum is flattened in systole and diastole, consistent with right ventricular pressure and volume  overload. Left ventricular diastolic parameters are indeterminate.  2. Right ventricular systolic function is moderately reduced. The right ventricular size is moderately enlarged. There is moderately elevated pulmonary artery systolic pressure. The estimated right ventricular systolic pressure is 15.7 mmHg.  3. Right atrial size was severely dilated.  4. The mitral valve is normal in structure. Trivial mitral valve regurgitation.  5. Tricuspid valve regurgitation is moderate.  6. The aortic valve is tricuspid. Aortic valve regurgitation is trivial. Mild to moderate aortic valve sclerosis/calcification is present, without any evidence of aortic stenosis.  7. The inferior vena cava is normal in size with <50% respiratory variability, suggesting right atrial pressure of 8 mmHg. FINDINGS  Left Ventricle: Left ventricular ejection fraction, by estimation, is 40 to 45%. The left ventricle has mildly decreased function. The left ventricle demonstrates regional wall motion abnormalities. The left ventricular internal cavity size was normal in size. There is mild left ventricular hypertrophy. The interventricular septum is flattened in systole and diastole, consistent with right ventricular pressure and volume overload. Left ventricular diastolic parameters are indeterminate. Right Ventricle: The right ventricular size is moderately enlarged. Right vetricular wall thickness was not well visualized. Right ventricular systolic function is moderately reduced. There is moderately elevated pulmonary artery systolic pressure. The tricuspid regurgitant velocity is 3.46 m/s, and with an assumed right atrial pressure of 8 mmHg, the estimated right ventricular systolic pressure is 26.2 mmHg. Left Atrium: Left atrial size was normal in size. Right Atrium: Right atrial size was severely dilated. Pericardium: There is no evidence of pericardial effusion. Mitral Valve: The mitral valve is normal in structure. Trivial mitral valve  regurgitation. Tricuspid Valve: The tricuspid valve is normal in structure. Tricuspid valve regurgitation is moderate. Aortic Valve: The aortic valve is tricuspid. Aortic valve regurgitation is trivial. Mild to moderate aortic valve  sclerosis/calcification is present, without any evidence of aortic stenosis. Pulmonic Valve: The pulmonic valve was grossly normal. Pulmonic valve regurgitation is trivial. Aorta: The aortic root and ascending aorta are structurally normal, with no evidence of dilitation. Venous: The inferior vena cava is normal in size with less than 50% respiratory variability, suggesting right atrial pressure of 8 mmHg. IAS/Shunts: The interatrial septum was not well visualized.  LEFT VENTRICLE PLAX 2D LVIDd:         4.10 cm LVIDs:         3.30 cm LV PW:         1.00 cm LV IVS:        1.10 cm LVOT diam:     2.00 cm LV SV:         28 LV SV Index:   14 LVOT Area:     3.14 cm  RIGHT VENTRICLE            IVC RV Basal diam:  4.50 cm    IVC diam: 1.90 cm RV Mid diam:    4.40 cm RV S prime:     7.62 cm/s TAPSE (M-mode): 1.2 cm LEFT ATRIUM             Index       RIGHT ATRIUM           Index LA diam:        4.20 cm 2.16 cm/m  RA Area:     29.20 cm LA Vol (A2C):   46.3 ml 23.83 ml/m RA Volume:   109.00 ml 56.10 ml/m LA Vol (A4C):   60.0 ml 30.88 ml/m LA Biplane Vol: 56.8 ml 29.23 ml/m  AORTIC VALVE LVOT Vmax:   49.70 cm/s LVOT Vmean:  32.100 cm/s LVOT VTI:    0.089 m  AORTA Ao Root diam: 2.90 cm Ao Asc diam:  2.90 cm TRICUSPID VALVE TR Peak grad:   47.9 mmHg TR Vmax:        346.00 cm/s  SHUNTS Systemic VTI:  0.09 m Systemic Diam: 2.00 cm Oswaldo Milian MD Electronically signed by Oswaldo Milian MD Signature Date/Time: 03/22/2021/8:06:18 PM    Final     Patient Profile     LYRICK LAGRAND is a 75 y.o. male with a hx of pulmonary fibrosis, coronary artery disease, hypertension, hyperlipidemia, atrial fibrillation who is being seen 03/23/2021 for the evaluation of possible congestive heart  failure at the request of Brand Males MD. Echocardiogram performed January 25, 2021 at [facility] that showed normal LV function, mild left ventricular hypertrophy, mild to moderate right ventricular enlargement, severe right atrial enlargement, mild tricuspid regurgitation.    Assessment & Plan    1. Acute diastolic congestive heart failure-as outlined yesterday patient's predominant issue is underlying pulmonary fibrosis.  There may be a small component of congestive heart failure and we will therefore continue low-dose Lasix to keep on the dry side as he will not tolerate any pulmonary edema.  I have personally reviewed his echocardiogram and feel that his ejection fraction is low normal in the 50 to 55% range; also with RV dysfunction secondary to pulmonary disease.  He is not a good candidate for invasive evaluation and the patient and his wife do not wish for aggressive procedures such as catheterization.  He is scheduled for hospice.  I will change Lasix to 40 mg by mouth daily.  He will need follow-up bmet as an outpatient.  I do not think he requires further cardiac follow-up.  2. Pulmonary fibrosis-prognosis poor.  His wife states he will be transition to hospice. 3. Persistent atrial fibrillation-continue beta-blocker for rate control.  Continue apixaban.  CHA2DS2-VASc is 5. 4. Coronary artery disease-he is on statin therapy.  Cardiology will sign off.  Please call with questions.  For questions or updates, please contact [place] Please consult [email or web address] for contact info under        Signed, Kirk Ruths, MD  03/24/2021, 10:56 AM

## 2021-03-24 NOTE — Progress Notes (Signed)
Daily Progress Note   Patient Name: Roger Sims       Date: 03/24/2021 DOB: Jan 02, 1946  Age: 75 y.o. MRN#: [id number] Attending Physician: Dessa Phi, DO Primary Care Physician: Thomes Dinning, MD Admit Date: 03/21/2021  Reason for Consultation/Follow-up: Establishing goals of care  Subjective: Patient is awake and alert. He is quiet and pleasant. His spouse shares they have decided to pursue Hospice care and are hopeful for residential hospice bed. Referral has been made by attending team.  He is eating bites and sips- this is actually an increase for him- likely due to steroids. His respiratory status decompensates quickly with any movement at all. He is sleeping well- and "all the time" per his own report- he is asleep more than he is awake. Discussed what goals of Hospice care are- family states they are familiar with Hospice as patient was enrolled with Hospice in the past.  Review of Systems  Respiratory: Positive for shortness of breath.   Psychiatric/Behavioral: Negative for depression. The patient is not nervous/anxious and does not have insomnia.     Length of Stay: 2  Current Medications: Scheduled Meds:  . apixaban  5 mg Oral BID  . furosemide  40 mg Oral Daily  . latanoprost  1 drop Both Eyes QHS  . nebivolol  10 mg Oral Daily  . [START ON 03/25/2021] predniSONE  50 mg Oral Q breakfast  . sodium chloride flush  3 mL Intravenous Q12H    Continuous Infusions: . sodium chloride      PRN Meds: sodium chloride, acetaminophen **OR** acetaminophen, ALPRAZolam, haloperidol **OR** haloperidol **OR** haloperidol lactate, morphine injection, ondansetron **OR** ondansetron (ZOFRAN) IV, sodium chloride flush  Physical Exam Vitals and nursing note reviewed.   Constitutional:      Comments: frail  Pulmonary:     Comments: Increased effort with even movement in bed            Vital Signs: BP 113/69 (BP Location: Left Arm)   Pulse 68   Temp 98.2 F (36.8 C)   Resp 20   Ht 5' 11" (1.803 m)   Wt 74 kg   SpO2 90%   BMI 22.75 kg/m  SpO2: SpO2: 90 % O2 Device: O2 Device: Nasal Cannula O2 Flow Rate: O2 Flow Rate (L/min): 6 L/min  Intake/output summary:  Intake/Output Summary (Last 24 hours) at 03/24/2021 1403 Last data filed at 03/24/2021 0416 Gross per 24 hour  Intake 746.3 ml  Output 2225 ml  Net -1478.7 ml   LBM: Last BM Date: 03/22/21 Baseline Weight: Weight: 74 kg Most recent weight: Weight: 74 kg       Palliative Assessment/Data: PPS: 20%      Patient Active Problem List   Diagnosis Date Noted  . A-fib ([place]) 03/22/2021  . DNR (do not resuscitate) 03/22/2021  . Goals of care, counseling/discussion 03/22/2021  . Shortness of breath 03/22/2021  . Palliative care status 03/22/2021  . Caregiver burden 03/22/2021  . Hyperlipidemia   . Acute on chronic respiratory failure with hypoxia ([place]) 03/21/2021  . Protein calorie malnutrition ([place]) 03/21/2021  . 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 IPF (idiopathic pulmonary fibrosis) ([place]) 09/15/2020  . Pulmonary emphysema ([place]) 09/15/2020  . Chronic respiratory failure with hypoxia ([place]) 09/15/2020  . Weight loss, unintentional 09/15/2020  . Healthcare maintenance 09/15/2020  . History of MI (myocardial infarction) 03/22/2010    Palliative Care Assessment & Plan   Patient Profile: 75 y.o.malewith past medical history of idiopathic pulmonary fibrosis, CAD, hypertension, and trail fibrillation on Eliquis. He presented to the emergency departmenton 5/30/2022with shortness of breath.Reporting 2 day acute deterioration - inability to ambulate, severe shortness of breath, and O2 saturation of 63% at home despite 6 liters of oxygen.  ED Course: BNP  613, WBC 14k. Chest x-ray shows ground glass opacities superimposed on top of his chronic IPF findings; radiologist suggests superimposed pneumonia or pulmonary edema. Admitted to [facility] with acute on chronic respiratory failure.  Assessment/Recommendations/Plan  Has been referred to Hospice by primary team, hopeful for inpatient placement- this is reasonable given patient is likely to require significant symptom mangement for respiratory failure Comfort medications ordered, discontinue cardiac monitoring, stop maintainance Crestor     Goals of Care and Additional Recommendations: Limitations on Scope of Treatment: Full Comfort Care  Code Status: DNR  Prognosis:  < 2 weeks  Discharge Planning: Hospice facility pending approval and bed availability  Care plan was discussed with patient's spouse and patient  Thank you for allowing the Palliative Medicine Team to assist in the care of this patient.   Total time: 39 mins Greater than 50%  of this time was spent counseling and coordinating care related to the above assessment and plan.  Mariana Kaufman, AGNP-C Palliative Medicine   Please contact Palliative Medicine Team phone at [phone] for questions and concerns.

## 2021-03-24 NOTE — Progress Notes (Signed)
NAME:  Roger Sims, MRN:  [id number], DOB:  08-16-1946, LOS: 2 ADMISSION DATE:  03/21/2021, CONSULTATION DATE:  03/22/21 REFERRING MD:  Roger Sims CHIEF COMPLAINT:  Hypoxic Respiratory Failure   briefs:  Roger Sims is a 75 y.o. male who has a PMH including but not limited to IPF.  He is followed by Roger. Chase Sims in the office and was last seen on 03/16/2021.  At the time, Roger. Chase Sims felt that he had had significant decline since prior visit and had a very poor prognosis.  Hospice was discussed and recommended; however, patient and wife preferred home palliation versus hospice.  They also discussed inhaled treprostinil and they were interested in possibly considering this.  Roger. Chase Sims sent a note to Roger. Haroldine Sims and Roger Sims regarding possibly getting a right heart cath since he would need this prior to any consideration of inhaled treprostinil.  The time was to improve quality of life and the plan was that if he continued to decline then they would need to put more emphasis on considering home hospice.  Unfortunately on 5/30, he had acute decline.  O2 saturations dropped down into the 60s.  Wife called the on-call pager and was instructed to take him to the ED as she was not able to manage.  In the ED, he was admitted by the hospitalist team for worsening hypoxic respiratory failure and possible underlying pneumonia and/or pulmonary edema.Marland Kitchen  He was started on Lasix as well as empiric cefepime and azithromycin.  PCCM was asked to see him for further recommendations.   Pertinent  Medical History:  has IPF (idiopathic pulmonary fibrosis) ([place]); Pulmonary emphysema ([place]); Chronic respiratory failure with hypoxia ([place]); Weight loss, unintentional; Healthcare maintenance; Acute on chronic respiratory failure with hypoxia ([place]); A-fib [facility]); DNR (do not resuscitate); Goals of care, counseling/discussion; Protein calorie malnutrition ([place]); WHO group 3 pulmonary arterial hypertension (Roger Sims); History of MI  (myocardial infarction); Hyperlipidemia; Shortness of breath; Palliative care status; and Caregiver burden on their problem list.  Significant Hospital Events: Including procedures, antibiotic start and stop dates in addition to other pertinent events      Micro Data: Flu 5/30 > neg. SARS CoV2 5/30 > neg. RV 5/31 - neg Urine strep 5/21 - neg PCT < 0.1  Antibiotics: Cefepime 5/30 > 6/1 Azithromycin 5/30 > 6/1  EVENTS 5/30 > admit. 5/31 > PCCM consult. Comfortable on 15L NRB.  Sats 98%.  Can likely drop O2 support some. Feels breathing slightly better. 6/1 - 6/1- admitted with top concern of terminal flare with life expectancy in order of days to weeks due to severe hypoxemia but with goals of concurrent palliation and basic medical care to look for reversible etiologies. New onset edema noted in ER (echo 01/25/21 at [facility] with  High prob for group 3 PAH and normal EF 70%). Echo here with low EF 45% - new finding. Is currently  On lasix and IV steroids for IPF flare.   Noted prior to this admit - family wanted to delay hospice till mother in law funeral (6/12) complete + he moves to a one story apartment (mid June 2022) and possible RHC (Roger Sims) was being set up  This mornign wife feels he is better. Down to [facility]  Interim History / Subjective:   03/24/2021 - carsd eval aptient. Goal is simple medical Rx with palliation. Curentlyu wife at beside. Reports stseroids making him hungry. He is awake but is now more deconditioned.  Had difficulty hoisting himself out of bed.  On 6l [place]  92% but easily desaturated in trying to transfer even with help . To 84%. Wife reports she is unable to care for him. She is overwhelemed. Edema beteer with lawsix  Objective:  Blood pressure 113/69, pulse 68, temperature 98.2 F (36.8 C), resp. rate 20, height _0  (1.803 m), weight 74 kg, SpO2 90 %.        Intake/Output Summary (Last 24 hours) at 03/24/2021 1144 Last data filed at 03/24/2021  0416 Gross per 24 hour  Intake 746.3 ml  Output 2225 ml  Net -1478.7 ml   Filed Weights   03/22/21 1943  Weight: 74 kg    Examination: General Appearance:  Looks deconditioned but better. FRail.  Head:  Normocephalic, without obvious abnormality, atraumatic Eyes:  PERRL - yes, conjunctiva/corneas - muddy     Ears:  Normal external ear canals, both ears Nose:  G tube - no but has o2 Throat:  ETT TUBE - no , OG tube - no Neck:  Supple,  No enlargement/tenderness/nodules Lungs: No overt incrased wob but desats easily with even sitting up. Looks stable at rest.  Heart:  S1 and S2 normal, no murmur, CVP - no.  Pressors - no Abdomen:  Soft, no masses, no organomegaly Genitalia / Rectal:  Not done Extremities:  Extremities- intact. Edema bette Skin:  ntact in exposed areas . Sacral area - not examined Neurologic:  Sedation - none -> RASS - +1 . Moves all 4s - yes. CAM-ICU - neg . Orientation - x3+         LABS   Results for Roger, Sims (MRN [id number]) as of 03/23/2021 09:58  Ref. Range 03/21/2021 20:47 03/22/2021 02:14  Troponin I (High Sensitivity) Latest Ref Range: <18 ng/L 29 (H) 31 (H)   PULMONARY Recent Labs  Lab 03/21/21 2138  PHART 7.484*  PCO2ART 31.5*  PO2ART 91  HCO3 23.8  TCO2 25  O2SAT 98.0    CBC Recent Labs  Lab 03/21/21 2047 03/21/21 2138 03/22/21 0214  HGB 16.4 16.7 16.3  HCT 49.4 49.0 49.5  WBC 14.3*  --  14.0*  PLT 129*  --  PLATELET CLUMPS NOTED ON SMEAR, UNABLE TO ESTIMATE    COAGULATION No results for input(s): INR in the last 168 hours.  CARDIAC  No results for input(s): TROPONINI in the last 168 hours. No results for input(s): PROBNP in the last 168 hours.   CHEMISTRY Recent Labs  Lab 03/21/21 2047 03/21/21 2138 03/22/21 0214 03/23/21 0408  NA 138 142 140 141  K 5.7* 3.6 3.8 3.7  CL 106  --  109 102  CO2 24  --  22 27  GLUCOSE 108*  --  101* 130*  BUN 22  --  20 24*  CREATININE 0.96  --  0.87 0.95  CALCIUM 8.6*  --   8.7* 8.9   Estimated Creatinine Clearance: 70.3 mL/min (by C-G formula based on SCr of 0.95 mg/dL).   LIVER Recent Labs  Lab 03/21/21 2047  AST 68*  ALT 28  ALKPHOS 70  BILITOT 3.7*  PROT 5.6*  ALBUMIN 2.9*     INFECTIOUS Recent Labs  Lab 03/22/21 0214 03/23/21 0408  PROCALCITON <0.10 <0.10     ENDOCRINE CBG (last 3)  No results for input(s): GLUCAP in the last 72 hours.       IMAGING x48h  - image(s) personally visualized  -   highlighted in bold ECHOCARDIOGRAM COMPLETE  Result Date: 03/22/2021    ECHOCARDIOGRAM REPORT  Patient Name:   BALDOMERO MIRARCHI Date of Exam: 03/22/2021 Medical Rec #:  [id number]     Height:       72.0 in Accession #:    [id number]    Weight:       161.0 lb Date of Birth:  1946/06/26     BSA:          1.943 m Patient Age:    12 years      BP:           148/104 mmHg Patient Gender: M             HR:           84 bpm. Exam Location:  Inpatient Procedure: 2D Echo Indications:    acute diastolic chf  History:        Patient has no prior history of Echocardiogram examinations.                 CAD, Arrythmias:Atrial Fibrillation, Signs/Symptoms:Shortness of                 Breath; Risk Factors:Hypertension and Dyslipidemia.  Sonographer:    Johny Chess Referring Phys: [phone] JARED M GARDNER  Sonographer Comments: Image acquisition challenging due to respiratory motion. IMPRESSIONS  1. Left ventricular ejection fraction, by estimation, is 40 to 45%. The left ventricle has mildly decreased function. The left ventricle demonstrates regional wall motion abnormalities. Septal hypokinesis. There is mild left ventricular hypertrophy. There is the interventricular septum is flattened in systole and diastole, consistent with right ventricular pressure and volume overload. Left ventricular diastolic parameters are indeterminate.  2. Right ventricular systolic function is moderately reduced. The right ventricular size is moderately enlarged. There is moderately elevated  pulmonary artery systolic pressure. The estimated right ventricular systolic pressure is 49.7 mmHg.  3. Right atrial size was severely dilated.  4. The mitral valve is normal in structure. Trivial mitral valve regurgitation.  5. Tricuspid valve regurgitation is moderate.  6. The aortic valve is tricuspid. Aortic valve regurgitation is trivial. Mild to moderate aortic valve sclerosis/calcification is present, without any evidence of aortic stenosis.  7. The inferior vena cava is normal in size with <50% respiratory variability, suggesting right atrial pressure of 8 mmHg. FINDINGS  Left Ventricle: Left ventricular ejection fraction, by estimation, is 40 to 45%. The left ventricle has mildly decreased function. The left ventricle demonstrates regional wall motion abnormalities. The left ventricular internal cavity size was normal in size. There is mild left ventricular hypertrophy. The interventricular septum is flattened in systole and diastole, consistent with right ventricular pressure and volume overload. Left ventricular diastolic parameters are indeterminate. Right Ventricle: The right ventricular size is moderately enlarged. Right vetricular wall thickness was not well visualized. Right ventricular systolic function is moderately reduced. There is moderately elevated pulmonary artery systolic pressure. The tricuspid regurgitant velocity is 3.46 m/s, and with an assumed right atrial pressure of 8 mmHg, the estimated right ventricular systolic pressure is 02.6 mmHg. Left Atrium: Left atrial size was normal in size. Right Atrium: Right atrial size was severely dilated. Pericardium: There is no evidence of pericardial effusion. Mitral Valve: The mitral valve is normal in structure. Trivial mitral valve regurgitation. Tricuspid Valve: The tricuspid valve is normal in structure. Tricuspid valve regurgitation is moderate. Aortic Valve: The aortic valve is tricuspid. Aortic valve regurgitation is trivial. Mild to  moderate aortic valve sclerosis/calcification is present, without any evidence of aortic stenosis. Pulmonic Valve: The pulmonic valve was grossly normal.  Pulmonic valve regurgitation is trivial. Aorta: The aortic root and ascending aorta are structurally normal, with no evidence of dilitation. Venous: The inferior vena cava is normal in size with less than 50% respiratory variability, suggesting right atrial pressure of 8 mmHg. IAS/Shunts: The interatrial septum was not well visualized.  LEFT VENTRICLE PLAX 2D LVIDd:         4.10 cm LVIDs:         3.30 cm LV PW:         1.00 cm LV IVS:        1.10 cm LVOT diam:     2.00 cm LV SV:         28 LV SV Index:   14 LVOT Area:     3.14 cm  RIGHT VENTRICLE            IVC RV Basal diam:  4.50 cm    IVC diam: 1.90 cm RV Mid diam:    4.40 cm RV S prime:     7.62 cm/s TAPSE (M-mode): 1.2 cm LEFT ATRIUM             Index       RIGHT ATRIUM           Index LA diam:        4.20 cm 2.16 cm/m  RA Area:     29.20 cm LA Vol (A2C):   46.3 ml 23.83 ml/m RA Volume:   109.00 ml 56.10 ml/m LA Vol (A4C):   60.0 ml 30.88 ml/m LA Biplane Vol: 56.8 ml 29.23 ml/m  AORTIC VALVE LVOT Vmax:   49.70 cm/s LVOT Vmean:  32.100 cm/s LVOT VTI:    0.089 m  AORTA Ao Root diam: 2.90 cm Ao Asc diam:  2.90 cm TRICUSPID VALVE TR Peak grad:   47.9 mmHg TR Vmax:        346.00 cm/s  SHUNTS Systemic VTI:  0.09 m Systemic Diam: 2.00 cm Oswaldo Milian MD Electronically signed by Oswaldo Milian MD Signature Date/Time: 03/22/2021/8:06:18 PM    Final      PROBLEM LIST      Patient Active Problem List   Diagnosis Date Noted  . DNR (do not resuscitate) 03/22/2021    Priority: High  . Palliative care status 03/22/2021    Priority: High  . Acute on chronic respiratory failure with hypoxia ([place]) 03/21/2021    Priority: High  . Protein calorie malnutrition ([place]) 03/21/2021    Priority: High  . WHO group 3 pulmonary arterial hypertension ([place]) 02/14/2021    Priority: High    Class:  Chronic  . IPF (idiopathic pulmonary fibrosis) ([place]) 09/15/2020    Priority: High  . Chronic respiratory failure with hypoxia ([facility]) 09/15/2020    Priority: High  . A-fib ([place]) 03/22/2021    Priority: Medium    Class: Chronic  . Caregiver burden 03/22/2021    Priority: Medium    Class: Acute  . Pulmonary emphysema ([place]) 09/15/2020    Priority: Medium  . Weight loss, unintentional 09/15/2020    Priority: Medium  . Goals of care, counseling/discussion 03/22/2021    Priority: Low  . Hyperlipidemia     Priority: Low    Class: Chronic  . History of MI (myocardial infarction) 03/22/2010    Priority: Low    Class: Chronic  . Shortness of breath 03/22/2021  . Healthcare maintenance 09/15/2020     Assessment & Plan:   Acute on chronic hypoxic respiratory failure   In setting of  IPF and clinical (echo 4/5)based WHO group 3 Pulm htn   - sever worsneing at admit 03/21/2021  - Infectious workup negative - Course: improved 03/23/21 with lasix and IV steroids but now plateauded. But still in severe category with easy desats with simplemovement. Making him bed bound ECOG 4  - Ddx:  - seems to have new onset systolic acute CHF (change in echo x 2 months). IPF flare (much pooer prognosis) is still leading ddx  _ GOals: no cath as d/w cards yesterday  Plan (d/w wife) -- continue lasix  And chf meds per cafds - steroids- change to po 70m per day 03/24/2021\ and taper to off over 3 weeks - off abx - o2 for pulse ox > 88% - increase palliative Rx for dyhspnea if in overt distress/hypoxemia - life expectancy: improved since admission but probably in order of  Several days or several weeks and possibly few months - heart health diet ok  - dispo: definitely NOT home.   Ccm will see  > 40 min spent on patient and > 50% in counseling coordination     SIGNATURE    Roger. MBrand Males M.D., F.C.C.P,  Pulmonary and Critical Care Medicine Staff Physician, [place] Director - Interstitial Lung Disease  Program  [place] [address] Pager: [phone] If no answer  OR between  19:00-7:00h: page [phone] (clinical office): [phone] Telephone (research): [phone]  11:44 AM 03/24/2021

## 2021-03-24 NOTE — Progress Notes (Signed)
PROGRESS NOTE    Roger Sims  ZOX:[id number] DOB: Feb 26, 1946 DOA: 03/21/2021 PCP: Thomes Dinning, MD     Brief Narrative:  Roger Sims is a 75 year old male with past medical history significant for IPF, CAD, hypertension, A. fib on Eliquis.  He has been having decline in lung function, is followed by Dr. Chase Caller for IPF.  He now presented to the emergency department with deterioration in his respiratory status, weakness, oxygen sats 63% at home despite 6 L oxygen use.  Patient was unable to get up to ambulate, and wife could no longer take care of him at home.  Patient initially required high flow oxygen, had very poor prognosis.  PCCM and palliative care medicine were consulted.  Due to decreased LVEF and regional wall motion abnormality cardiology was consulted.  Cardiology did not recommend any further aggressive interventions.  They did start low-dose Lasix.  New events last 24 hours / Subjective: Patient remains on 6 L oxygen.  Wife at bedside states that when patient was getting adjusted in bed, his pulse ox dropped to 88%.  Wife states that they have decided with cardiology not to pursue heart cath.  She wants to pursue residential hospice placement.  Assessment & Plan:   Principal Problem:   Acute on chronic respiratory failure with hypoxia (HCC) Active Problems:   IPF (idiopathic pulmonary fibrosis) (HCC)   Chronic respiratory failure with hypoxia (HCC)   Weight loss, unintentional   A-fib (HCC)   DNR (do not resuscitate)   Goals of care, counseling/discussion   Protein calorie malnutrition ([place])   WHO group 3 pulmonary arterial hypertension (HCC)   History of MI (myocardial infarction)   Hyperlipidemia   Shortness of breath   Palliative care status   Caregiver burden   Acute on chronic hypoxemic respiratory failure -Oxygen sat of 63% on 6 L O2, initially required high flow/nonrebreather mask.  Remains on 6 L today. -Procalcitonin negative, stop  antibiotics -Respiratory viral panel, MRSA PCR, COVID negative -Appreciate PCCM  Acute diastolic CHF -BNP 811.9  -Echocardiogram EF 40 to 45%, regional wall motion abnormality of left ventricle, septal hypokinesis, interventricular septum is flattened in systole and diastole, consistent with right ventricular pressure and volume overload -Appreciate cardiology, and Dr. Jacalyn Lefevre echocardiogram reviewed, he feels that patient's ejection fraction is 50 to 55% range, RV dysfunction secondary to pulmonary disease.  He is not a good candidate for invasive evaluation, family has decided not to pursue aggressive procedure such as heart cath -Continue Lasix  IPF -Continue Solu-Medrol -Appreciate PCCM  A. fib -Continue Eliquis, bystolic   DVT prophylaxis:   apixaban (ELIQUIS) tablet 5 mg  Code Status:     Code Status Orders  (From admission, onward)         Start     Ordered   03/22/21 0048  Do not attempt resuscitation (DNR)  Continuous       Question Answer Comment  In the event of cardiac or respiratory ARREST Do not call a "code blue"   In the event of cardiac or respiratory ARREST Do not perform Intubation, CPR, defibrillation or ACLS   In the event of cardiac or respiratory ARREST Use medication by any route, position, wound care, and other measures to relive pain and suffering. May use oxygen, suction and manual treatment of airway obstruction as needed for comfort.      03/22/21 0047        Code Status History    Date Active Date Inactive Code Status  Order ID Comments User Context   03/22/2021 0017 03/22/2021 0047 DNR [id number]  [name], DO ED   Advance Care Planning Activity     Family Communication: Spouse at bedside Disposition Plan:  Status is: Inpatient  Remains inpatient appropriate because:Hemodynamically unstable, Unsafe d/c plan, IV treatments appropriate due to intensity of illness or inability to take PO and Inpatient level of care appropriate due to  severity of illness   Dispo: The patient is from: Home              Anticipated d/c is to: Residential hospice              Patient currently is medically stable to d/c. Awaiting residential hospice placement.    Difficult to place patient No      Consultants:   PCCM  Palliative care medicine  Cardiology   Antimicrobials:  Anti-infectives (From admission, onward)   Start     Dose/Rate Route Frequency Ordered Stop   03/22/21 0600  ceFEPIme (MAXIPIME) 2 g in sodium chloride 0.9 % 100 mL IVPB  Status:  Discontinued        2 g 200 mL/hr over 30 Minutes Intravenous Every 8 hours 03/22/21 0315 03/23/21 0941   03/22/21 0400  azithromycin (ZITHROMAX) 500 mg in sodium chloride 0.9 % 250 mL IVPB  Status:  Discontinued        500 mg 250 mL/hr over 60 Minutes Intravenous Every 24 hours 03/22/21 0314 03/23/21 0941   03/21/21 2200  ceFEPIme (MAXIPIME) 2 g in sodium chloride 0.9 % 100 mL IVPB        2 g 200 mL/hr over 30 Minutes Intravenous  Once 03/21/21 2154 03/22/21 0039       Objective: Vitals:   03/23/21 0513 03/23/21 0755 03/23/21 2133 03/24/21 0533  BP: (!) 126/98 (!) 146/97 (!) 134/93 113/69  Pulse: 90 72 69 68  Resp: _0 Temp: 98.2 F (36.8 C) (!) 97.2 F (36.2 C) 98.1 F (36.7 C) 98.2 F (36.8 C)  TempSrc:  Axillary    SpO2: 96% 97% 93% 90%  Weight:      Height:        Intake/Output Summary (Last 24 hours) at 03/24/2021 1132 Last data filed at 03/24/2021 0416 Gross per 24 hour  Intake 746.3 ml  Output 2225 ml  Net -1478.7 ml   Filed Weights   03/22/21 1943  Weight: 74 kg    Examination: General exam: Appears calm and comfortable  Respiratory system: Clear to auscultation anteriorly. Respiratory effort normal. On 6L O2  Cardiovascular system: S1 & S2 heard, RRR. No pedal edema. Gastrointestinal system: Abdomen is nondistended, soft and nontender. Normal bowel sounds heard. Central nervous system: Alert. Speech clear  Extremities: Symmetric in  appearance bilaterally  Skin: No rashes, lesions or ulcers on exposed skin  Psychiatry: Judgement and insight appear stable. Mood & affect appropriate.    Data Reviewed: I have personally reviewed following labs and imaging studies  CBC: Recent Labs  Lab 03/21/21 2047 03/21/21 2138 03/22/21 0214  WBC 14.3*  --  14.0*  NEUTROABS 12.4*  --   --   HGB 16.4 16.7 16.3  HCT 49.4 49.0 49.5  MCV 94.5  --  94.5  PLT 129*  --  PLATELET CLUMPS NOTED ON SMEAR, UNABLE TO ESTIMATE   Basic Metabolic Panel: Recent Labs  Lab 03/21/21 2047 03/21/21 2138 03/22/21 0214 03/23/21 0408  NA 138 142 140 141  K 5.7* 3.6 3.8  3.7  CL 106  --  109 102  CO2 24  --  22 27  GLUCOSE 108*  --  101* 130*  BUN 22  --  20 24*  CREATININE 0.96  --  0.87 0.95  CALCIUM 8.6*  --  8.7* 8.9   GFR: Estimated Creatinine Clearance: 70.3 mL/min (by C-G formula based on SCr of 0.95 mg/dL). Liver Function Tests: Recent Labs  Lab 03/21/21 2047  AST 68*  ALT 28  ALKPHOS 70  BILITOT 3.7*  PROT 5.6*  ALBUMIN 2.9*   No results for input(s): LIPASE, AMYLASE in the last 168 hours. No results for input(s): AMMONIA in the last 168 hours. Coagulation Profile: No results for input(s): INR, PROTIME in the last 168 hours. Cardiac Enzymes: No results for input(s): CKTOTAL, CKMB, CKMBINDEX, TROPONINI in the last 168 hours. BNP (last 3 results) No results for input(s): PROBNP in the last 8760 hours. HbA1C: No results for input(s): HGBA1C in the last 72 hours. CBG: No results for input(s): GLUCAP in the last 168 hours. Lipid Profile: No results for input(s): CHOL, HDL, LDLCALC, TRIG, CHOLHDL, LDLDIRECT in the last 72 hours. Thyroid Function Tests: No results for input(s): TSH, T4TOTAL, FREET4, T3FREE, THYROIDAB in the last 72 hours. Anemia Panel: No results for input(s): VITAMINB12, FOLATE, FERRITIN, TIBC, IRON, RETICCTPCT in the last 72 hours. Sepsis Labs: Recent Labs  Lab 03/22/21 0214 03/23/21 0408   PROCALCITON <0.10 <0.10    Recent Results (from the past 240 hour(s))  Resp Panel by RT-PCR (Flu A&B, Covid) Nasopharyngeal Swab     Status: None   Collection Time: 03/21/21 11:41 PM   Specimen: Nasopharyngeal Swab; Nasopharyngeal(NP) swabs in vial transport medium  Result Value Ref Range Status   SARS Coronavirus 2 by RT PCR NEGATIVE NEGATIVE Final    Comment: (NOTE) SARS-CoV-2 target nucleic acids are NOT DETECTED.  The SARS-CoV-2 RNA is generally detectable in upper respiratory specimens during the acute phase of infection. The lowest concentration of SARS-CoV-2 viral copies this assay can detect is 138 copies/mL. A negative result does not preclude SARS-Cov-2 infection and should not be used as the sole basis for treatment or other patient management decisions. A negative result may occur with  improper specimen collection/handling, submission of specimen other than nasopharyngeal swab, presence of viral mutation(s) within the areas targeted by this assay, and inadequate number of viral copies(<138 copies/mL). A negative result must be combined with clinical observations, patient history, and epidemiological information. The expected result is Negative.  Fact Sheet for Patients:  EntrepreneurPulse.com.au  Fact Sheet for Healthcare Providers:  IncredibleEmployment.be  This test is no t yet approved or cleared by the [place] FDA and  has been authorized for detection and/or diagnosis of SARS-CoV-2 by FDA under an Emergency Use Authorization (EUA). This EUA will remain  in effect (meaning this test can be used) for the duration of the COVID-19 declaration under Section 564(b)(1) of the Act, 21 U.S.C.section 360bbb-3(b)(1), unless the authorization is terminated  or revoked sooner.       Influenza A by PCR NEGATIVE NEGATIVE Final   Influenza B by PCR NEGATIVE NEGATIVE Final    Comment: (NOTE) The Xpert Xpress SARS-CoV-2/FLU/RSV  plus assay is intended as an aid in the diagnosis of influenza from Nasopharyngeal swab specimens and should not be used as a sole basis for treatment. Nasal washings and aspirates are unacceptable for Xpert Xpress SARS-CoV-2/FLU/RSV testing.  Fact Sheet for Patients: EntrepreneurPulse.com.au  Fact Sheet for Healthcare Providers: IncredibleEmployment.be  This  test is not yet approved or cleared by the [place] and has been authorized for detection and/or diagnosis of SARS-CoV-2 by FDA under an Emergency Use Authorization (EUA). This EUA will remain in effect (meaning this test can be used) for the duration of the COVID-19 declaration under Section 564(b)(1) of the Act, 21 U.S.C. section 360bbb-3(b)(1), unless the authorization is terminated or revoked.  Performed at [facility], [place] [address] [place], [place] [address]   MRSA PCR Screening     Status: None   Collection Time: 03/22/21  4:14 AM   Specimen: Nasopharyngeal  Result Value Ref Range Status   MRSA by PCR NEGATIVE NEGATIVE Final    Comment:        The GeneXpert MRSA Assay (FDA approved for NASAL specimens only), is one component of a comprehensive MRSA colonization surveillance program. It is not intended to diagnose MRSA infection nor to guide or monitor treatment for MRSA infections. Performed at [facility], [place] [address], [place], [place] [address]   Respiratory (~20 pathogens) panel by PCR     Status: None   Collection Time: 03/22/21 10:50 AM   Specimen: Nasopharyngeal Swab; Respiratory  Result Value Ref Range Status   Adenovirus NOT DETECTED NOT DETECTED Final   Coronavirus 229E NOT DETECTED NOT DETECTED Final    Comment: (NOTE) The Coronavirus on the Respiratory Panel, DOES NOT test for the novel  Coronavirus (2019 nCoV)    Coronavirus HKU1 NOT DETECTED NOT DETECTED Final   Coronavirus NL63 NOT DETECTED NOT DETECTED Final   Coronavirus OC43  NOT DETECTED NOT DETECTED Final   Metapneumovirus NOT DETECTED NOT DETECTED Final   Rhinovirus / Enterovirus NOT DETECTED NOT DETECTED Final   Influenza A NOT DETECTED NOT DETECTED Final   Influenza B NOT DETECTED NOT DETECTED Final   Parainfluenza Virus 1 NOT DETECTED NOT DETECTED Final   Parainfluenza Virus 2 NOT DETECTED NOT DETECTED Final   Parainfluenza Virus 3 NOT DETECTED NOT DETECTED Final   Parainfluenza Virus 4 NOT DETECTED NOT DETECTED Final   Respiratory Syncytial Virus NOT DETECTED NOT DETECTED Final   Bordetella pertussis NOT DETECTED NOT DETECTED Final   Bordetella Parapertussis NOT DETECTED NOT DETECTED Final   Chlamydophila pneumoniae NOT DETECTED NOT DETECTED Final   Mycoplasma pneumoniae NOT DETECTED NOT DETECTED Final    Comment: Performed at [facility], [place]. [address] [place], [place] [address]      Radiology Studies: ECHOCARDIOGRAM COMPLETE  Result Date: 03/22/2021    ECHOCARDIOGRAM REPORT   Patient Name:   KEONA SHEFFLER Date of Exam: 03/22/2021 Medical Rec #:  [id number]     Height:       72.0 in Accession #:    [id number]    Weight:       161.0 lb Date of Birth:  [birth year]     BSA:          1.943 m Patient Age:    17 years      BP:           148/104 mmHg Patient Gender: M             HR:           84 bpm. Exam Location:  Inpatient Procedure: 2D Echo Indications:    acute diastolic chf  History:        Patient has no prior history of Echocardiogram examinations.  CAD, Arrythmias:Atrial Fibrillation, Signs/Symptoms:Shortness of                 Breath; Risk Factors:Hypertension and Dyslipidemia.  Sonographer:    Johny Chess Referring Phys: [phone] JARED M GARDNER  Sonographer Comments: Image acquisition challenging due to respiratory motion. IMPRESSIONS  1. Left ventricular ejection fraction, by estimation, is 40 to 45%. The left ventricle has mildly decreased function. The left ventricle demonstrates regional wall motion abnormalities. Septal  hypokinesis. There is mild left ventricular hypertrophy. There is the interventricular septum is flattened in systole and diastole, consistent with right ventricular pressure and volume overload. Left ventricular diastolic parameters are indeterminate.  2. Right ventricular systolic function is moderately reduced. The right ventricular size is moderately enlarged. There is moderately elevated pulmonary artery systolic pressure. The estimated right ventricular systolic pressure is 63.1 mmHg.  3. Right atrial size was severely dilated.  4. The mitral valve is normal in structure. Trivial mitral valve regurgitation.  5. Tricuspid valve regurgitation is moderate.  6. The aortic valve is tricuspid. Aortic valve regurgitation is trivial. Mild to moderate aortic valve sclerosis/calcification is present, without any evidence of aortic stenosis.  7. The inferior vena cava is normal in size with <50% respiratory variability, suggesting right atrial pressure of 8 mmHg. FINDINGS  Left Ventricle: Left ventricular ejection fraction, by estimation, is 40 to 45%. The left ventricle has mildly decreased function. The left ventricle demonstrates regional wall motion abnormalities. The left ventricular internal cavity size was normal in size. There is mild left ventricular hypertrophy. The interventricular septum is flattened in systole and diastole, consistent with right ventricular pressure and volume overload. Left ventricular diastolic parameters are indeterminate. Right Ventricle: The right ventricular size is moderately enlarged. Right vetricular wall thickness was not well visualized. Right ventricular systolic function is moderately reduced. There is moderately elevated pulmonary artery systolic pressure. The tricuspid regurgitant velocity is 3.46 m/s, and with an assumed right atrial pressure of 8 mmHg, the estimated right ventricular systolic pressure is 49.7 mmHg. Left Atrium: Left atrial size was normal in size. Right  Atrium: Right atrial size was severely dilated. Pericardium: There is no evidence of pericardial effusion. Mitral Valve: The mitral valve is normal in structure. Trivial mitral valve regurgitation. Tricuspid Valve: The tricuspid valve is normal in structure. Tricuspid valve regurgitation is moderate. Aortic Valve: The aortic valve is tricuspid. Aortic valve regurgitation is trivial. Mild to moderate aortic valve sclerosis/calcification is present, without any evidence of aortic stenosis. Pulmonic Valve: The pulmonic valve was grossly normal. Pulmonic valve regurgitation is trivial. Aorta: The aortic root and ascending aorta are structurally normal, with no evidence of dilitation. Venous: The inferior vena cava is normal in size with less than 50% respiratory variability, suggesting right atrial pressure of 8 mmHg. IAS/Shunts: The interatrial septum was not well visualized.  LEFT VENTRICLE PLAX 2D LVIDd:         4.10 cm LVIDs:         3.30 cm LV PW:         1.00 cm LV IVS:        1.10 cm LVOT diam:     2.00 cm LV SV:         28 LV SV Index:   14 LVOT Area:     3.14 cm  RIGHT VENTRICLE            IVC RV Basal diam:  4.50 cm    IVC diam: 1.90 cm RV Mid diam:    4.40  cm RV S prime:     7.62 cm/s TAPSE (M-mode): 1.2 cm LEFT ATRIUM             Index       RIGHT ATRIUM           Index LA diam:        4.20 cm 2.16 cm/m  RA Area:     29.20 cm LA Vol (A2C):   46.3 ml 23.83 ml/m RA Volume:   109.00 ml 56.10 ml/m LA Vol (A4C):   60.0 ml 30.88 ml/m LA Biplane Vol: 56.8 ml 29.23 ml/m  AORTIC VALVE LVOT Vmax:   49.70 cm/s LVOT Vmean:  32.100 cm/s LVOT VTI:    0.089 m  AORTA Ao Root diam: 2.90 cm Ao Asc diam:  2.90 cm TRICUSPID VALVE TR Peak grad:   47.9 mmHg TR Vmax:        346.00 cm/s  SHUNTS Systemic VTI:  0.09 m Systemic Diam: 2.00 cm Oswaldo Milian MD Electronically signed by Oswaldo Milian MD Signature Date/Time: 03/22/2021/8:06:18 PM    Final       Scheduled Meds: . apixaban  5 mg Oral BID  .  furosemide  40 mg Oral Daily  . latanoprost  1 drop Both Eyes QHS  . methylPREDNISolone (SOLU-MEDROL) injection  125 mg Intravenous Q6H  . nebivolol  10 mg Oral Daily  . rosuvastatin  20 mg Oral Daily   Continuous Infusions: . dextrose 5% lactated ringers 10 mL/hr at 03/23/21 0456     LOS: 2 days      Time spent: 30 minutes   Dessa Phi, DO [facility] 03/24/2021, 11:32 AM   Available via Epic secure chat 7am-7pm After these hours, please refer to coverage provider listed on [email or web address]

## 2021-03-24 NOTE — Progress Notes (Signed)
Pt in bed resting with wife at bedside. No distress noted.

## 2021-03-24 NOTE — Progress Notes (Signed)
CSW continues to follow for discharge needs. Lurline Idol, MSW, LCSW 6/2/20229:02 AM

## 2021-03-25 DIAGNOSIS — E43 Unspecified severe protein-calorie malnutrition: Secondary | ICD-10-CM

## 2021-03-25 DIAGNOSIS — J9621 Acute and chronic respiratory failure with hypoxia: Secondary | ICD-10-CM | POA: Diagnosis not present

## 2021-03-25 DIAGNOSIS — I252 Old myocardial infarction: Secondary | ICD-10-CM

## 2021-03-25 NOTE — Discharge Instructions (Signed)

## 2021-03-25 NOTE — Progress Notes (Signed)
   We unfortunately do not have an available bed to offer today. We will continue to follow and let SW know if a bed comes available today or over the weekend.    Webb Silversmith RN [phone]

## 2021-03-25 NOTE — Progress Notes (Signed)
Daily Progress Note   Patient Name: Roger Sims       Date: 03/25/2021 DOB: Feb 17, 1946  Age: 75 y.o. MRN#: [id number] Attending Physician: Dessa Phi, DO Primary Care Physician: Thomes Dinning, MD Admit Date: 03/21/2021  Reason for Consultation/Follow-up: Establishing goals of care  Subjective: Patient is comfortable.  Undergoing personal care, a visit.  Noted he has been approved for residential hospice bed-awaiting bed availability.   Length of Stay: 3  Current Medications: Scheduled Meds:  . apixaban  5 mg Oral BID  . furosemide  40 mg Oral Daily  . latanoprost  1 drop Both Eyes QHS  . nebivolol  10 mg Oral Daily  . predniSONE  50 mg Oral Q breakfast  . sodium chloride flush  3 mL Intravenous Q12H    Continuous Infusions: . sodium chloride      PRN Meds: sodium chloride, acetaminophen **OR** acetaminophen, ALPRAZolam, haloperidol **OR** haloperidol **OR** haloperidol lactate, LORazepam, morphine injection, ondansetron **OR** ondansetron (ZOFRAN) IV, sodium chloride flush      Palliative Care Assessment & Plan   Patient Profile: 75 y.o.malewith past medical history of idiopathic pulmonary fibrosis, CAD, hypertension, and trail fibrillation on Eliquis. He presented to the emergency departmenton 5/30/2022with shortness of breath.Reporting 2 day acute deterioration - inability to ambulate, severe shortness of breath, and O2 saturation of 63% at home despite 6 liters of oxygen.  ED Course: BNP 613, WBC 14k. Chest x-ray shows ground glass opacities superimposed on top of his chronic IPF findings; radiologist suggests superimposed pneumonia or pulmonary edema. Admitted to [facility] with acute on chronic respiratory failure.  Assessment/Recommendations/Plan  Continue  current comfort interventions Discharge to residential hospice when bed is available  Goals of Care and Additional Recommendations: Limitations on Scope of Treatment: Full Comfort Care  Code Status: DNR  Prognosis:  < 2 weeks  Discharge Planning: Hospice facility  Care plan was discussed with patient's spouse.   Thank you for allowing the Palliative Medicine Team to assist in the care of this patient.   Total time: 22 mins Greater than 50%  of this time was spent counseling and coordinating care related to the above assessment and plan.  Mariana Kaufman, AGNP-C Palliative Medicine   Please contact Palliative Medicine Team phone at [phone] for questions and concerns.

## 2021-03-25 NOTE — Care Management Important Message (Signed)
Important Message  Patient Details  Name: Roger Sims MRN: [id number] Date of Birth: April 04, 1946   Medicare Important Message Given:  Yes     Orbie Pyo 03/25/2021, 2:36 PM

## 2021-03-25 NOTE — [place] Notes (Signed)
NAME:  Roger Sims, MRN:  [id number], DOB:  [birth year], LOS: 3 ADMISSION DATE:  03/21/2021, CONSULTATION DATE:  03/22/21 REFERRING MD:  Alcario Drought CHIEF COMPLAINT:  Hypoxic Respiratory Failure   briefs:  Roger Sims is a 75 y.o. male who has a PMH including but not limited to IPF.  He is followed by Dr. Chase Caller in the office and was last seen on 03/16/2021.  At the time, Dr. Chase Caller felt that he had had significant decline since prior visit and had a very poor prognosis.  Hospice was discussed and recommended; however, patient and wife preferred home palliation versus hospice.  They also discussed inhaled treprostinil and they were interested in possibly considering this.  Dr. Chase Caller sent a note to Dr. Haroldine Laws and Aundra Dubin regarding possibly getting a right heart cath since he would need this prior to any consideration of inhaled treprostinil.  The time was to improve quality of life and the plan was that if he continued to decline then they would need to put more emphasis on considering home hospice.  Unfortunately on 5/30, he had acute decline.  O2 saturations dropped down into the 60s.  Wife called the on-call pager and was instructed to take him to the ED as she was not able to manage.  In the ED, he was admitted by the hospitalist team for worsening hypoxic respiratory failure and possible underlying pneumonia and/or pulmonary edema.Marland Kitchen  He was started on Lasix as well as empiric cefepime and azithromycin.  PCCM was asked to see him for further recommendations.   Pertinent  Medical History:  has IPF (idiopathic pulmonary fibrosis) ([place]); Pulmonary emphysema ([place]); Chronic respiratory failure with hypoxia ([place]); Weight loss, unintentional; Healthcare maintenance; Acute on chronic respiratory failure with hypoxia ([place]); A-fib [facility]); DNR (do not resuscitate); Goals of care, counseling/discussion; Protein calorie malnutrition ([place]); WHO group 3 pulmonary arterial hypertension ([place]); History of MI  (myocardial infarction); Hyperlipidemia; Shortness of breath; Palliative care status; and Caregiver burden on their problem list.  Significant Hospital Events: Including procedures, antibiotic start and stop dates in addition to other pertinent events      Micro Data: Flu 5/30 > neg. SARS CoV2 5/30 > neg. RV 5/31 - neg Urine strep 5/21 - neg PCT < 0.1  Antibiotics: Cefepime 5/30 > 6/1 Azithromycin 5/30 > 6/1  EVENTS 5/30 > admit. 5/31 > PCCM consult. Comfortable on 15L NRB.  Sats 98%.  Can likely drop O2 support some. Feels breathing slightly better. 6/1 - 6/1- admitted with top concern of terminal flare with life expectancy in order of days to weeks due to severe hypoxemia but with goals of concurrent palliation and basic medical care to look for reversible etiologies. New onset edema noted in ER (echo 01/25/21 at [facility] with  High prob for group 3 PAH and normal EF 70%). Echo here with low EF 45% - new finding. Is currently  On lasix and IV steroids for IPF flare.   Noted prior to this admit - family wanted to delay hospice till mother in law funeral (6/12) complete + he moves to a one story apartment (mid June 2022) and possible RHC (dr Jerilee Field Aundra Dubin) was being set up  This mornign wife feels he is better. Down to [facility]  Interim History / Subjective:   More upbeat. No acute resp distress  Objective:  Blood pressure (!) 91/56, pulse 73, temperature (!) 97.5 F (36.4 C), temperature source Oral, resp. rate 17, height _0  (1.803 m), weight 74 kg, SpO2 90 %.  Intake/Output Summary (Last 24 hours) at 03/25/2021 0849 Last data filed at 03/25/2021 0836 Gross per 24 hour  Intake 120 ml  Output 1075 ml  Net -955 ml   Filed Weights   03/22/21 1943  Weight: 74 kg    Examination: General:  Frail elderly HEENT: MM pink/moist no jvd Neuro: confused CV: hsd PULM:  Decreased bs bases rhonci blat  GI: soft, bsx4 active  Extremities: warm/dry, 2+ edema  Skin: no  rashes or lesions          LABS   Results for Roger Sims, Roger Sims (MRN [id number]) as of 03/23/2021 09:58  Ref. Range 03/21/2021 20:47 03/22/2021 02:14  Troponin I (High Sensitivity) Latest Ref Range: <18 ng/L 29 (H) 31 (H)   PULMONARY Recent Labs  Lab 03/21/21 2138  PHART 7.484*  PCO2ART 31.5*  PO2ART 91  HCO3 23.8  TCO2 25  O2SAT 98.0    CBC Recent Labs  Lab 03/21/21 2047 03/21/21 2138 03/22/21 0214  HGB 16.4 16.7 16.3  HCT 49.4 49.0 49.5  WBC 14.3*  --  14.0*  PLT 129*  --  PLATELET CLUMPS NOTED ON SMEAR, UNABLE TO ESTIMATE    COAGULATION No results for input(s): INR in the last 168 hours.  CARDIAC  No results for input(s): TROPONINI in the last 168 hours. No results for input(s): PROBNP in the last 168 hours.   CHEMISTRY Recent Labs  Lab 03/21/21 2047 03/21/21 2138 03/22/21 0214 03/23/21 0408  NA 138 142 140 141  K 5.7* 3.6 3.8 3.7  CL 106  --  109 102  CO2 24  --  22 27  GLUCOSE 108*  --  101* 130*  BUN 22  --  20 24*  CREATININE 0.96  --  0.87 0.95  CALCIUM 8.6*  --  8.7* 8.9   Estimated Creatinine Clearance: 70.3 mL/min (by C-G formula based on SCr of 0.95 mg/dL).   LIVER Recent Labs  Lab 03/21/21 2047  AST 68*  ALT 28  ALKPHOS 70  BILITOT 3.7*  PROT 5.6*  ALBUMIN 2.9*     INFECTIOUS Recent Labs  Lab 03/22/21 0214 03/23/21 0408  PROCALCITON <0.10 <0.10     ENDOCRINE CBG (last 3)  No results for input(s): GLUCAP in the last 72 hours.       IMAGING x48h  - image(s) personally visualized  -   highlighted in bold No results found.   PROBLEM LIST      Patient Active Problem List   Diagnosis Date Noted  . A-fib ([place]) 03/22/2021    Class: Chronic  . DNR (do not resuscitate) 03/22/2021  . Goals of care, counseling/discussion 03/22/2021  . Shortness of breath 03/22/2021  . Palliative care status 03/22/2021  . Caregiver burden 03/22/2021    Class: Acute  . Hyperlipidemia     Class: Chronic  . Acute on chronic  respiratory failure with hypoxia ([place]) 03/21/2021  . Protein calorie malnutrition ([place]) 03/21/2021  . WHO group 3 pulmonary arterial hypertension ([place]) 02/14/2021    Class: Chronic  . IPF (idiopathic pulmonary fibrosis) ([place]) 09/15/2020  . Pulmonary emphysema ([place]) 09/15/2020  . Chronic respiratory failure with hypoxia ([place]) 09/15/2020  . Weight loss, unintentional 09/15/2020  . Healthcare maintenance 09/15/2020  . History of MI (myocardial infarction) 03/22/2010    Class: Chronic     Assessment & Plan:   Acute on chronic hypoxic respiratory failure   In setting of IPF and clinical (echo 4/5)based WHO group 3 Pulm htn   Stable  on 8 l [place] decrease as able Palliative care involved  Tx to hospice Comfort meds a needed PCCM to follow     > 40 min spent on patient and > 50% in counseling coordination     [place] Nurse Practitioner [place] Please consult Amion 03/25/2021, 8:50 AM

## 2021-03-25 NOTE — Plan of Care (Signed)
  Problem: Clinical Measurements: Goal: Ability to maintain clinical measurements within normal limits will improve Outcome: Progressing Goal: Will remain free from infection Outcome: Progressing Goal: Diagnostic test results will improve Outcome: Progressing Goal: Respiratory complications will improve Outcome: Progressing Goal: Cardiovascular complication will be avoided Outcome: Progressing   Problem: Activity: Goal: Risk for activity intolerance will decrease Outcome: Progressing   Problem: Nutrition: Goal: Adequate nutrition will be maintained Outcome: Progressing   Problem: Coping: Goal: Level of anxiety will decrease Outcome: Progressing   Problem: Elimination: Goal: Will not experience complications related to bowel motility Outcome: Progressing Goal: Will not experience complications related to urinary retention Outcome: Progressing   Problem: Pain Managment: Goal: General experience of comfort will improve Outcome: Progressing   Problem: Safety: Goal: Ability to remain free from injury will improve Outcome: Progressing   Problem: Skin Integrity: Goal: Risk for impaired skin integrity will decrease Outcome: Progressing   Problem: Education: Goal: Knowledge of General Education information will improve Description: Including pain rating scale, medication(s)/side effects and non-pharmacologic comfort measures Outcome: Not Progressing   Problem: Health Behavior/Discharge Planning: Goal: Ability to manage health-related needs will improve Outcome: Not Progressing

## 2021-03-25 NOTE — TOC Progression Note (Signed)
Transition of Care [facility]) - Progression Note    Patient Details  Name: Roger Sims MRN: [id number] Date of Birth: 1946-02-01  Transition of Care [facility]) CM/SW Contact  Joanne Chars, LCSW Phone Number: 03/25/2021, 10:04 AM  Clinical Narrative:   Update from [facility].  Pt eligible for residential hospice, no beds currently but potential beds later today.      Expected Discharge Plan: [place] Barriers to Discharge: Continued Medical Work up,Other (must enter comment) (pending hospice evaluation)  Expected Discharge Plan and Services Expected Discharge Plan: [place] In-house Referral: Clinical Social Work   Post Acute Care Choice: Hospice Living arrangements for the past 2 months: [place]:  (Residential Hospice) [place] Date [place]: 03/24/21 Time [place]: 1354 Representative spoke with at [place]: [place] ([place]) Interventions    Readmission Risk Interventions No flowsheet data found.

## 2021-03-25 NOTE — Plan of Care (Signed)
  Problem: Health Behavior/Discharge Planning: Goal: Ability to manage health-related needs will improve Outcome: Progressing   Problem: Clinical Measurements: Goal: Ability to maintain clinical measurements within normal limits will improve Outcome: Progressing Goal: Will remain free from infection Outcome: Progressing Goal: Diagnostic test results will improve Outcome: Progressing Goal: Respiratory complications will improve Outcome: Progressing Goal: Cardiovascular complication will be avoided Outcome: Progressing   Problem: Activity: Goal: Risk for activity intolerance will decrease Outcome: Progressing   Problem: Nutrition: Goal: Adequate nutrition will be maintained Outcome: Progressing   Problem: Coping: Goal: Level of anxiety will decrease Outcome: Progressing   Problem: Elimination: Goal: Will not experience complications related to bowel motility Outcome: Progressing Goal: Will not experience complications related to urinary retention Outcome: Progressing   Problem: Pain Managment: Goal: General experience of comfort will improve Outcome: Progressing   Problem: Safety: Goal: Ability to remain free from injury will improve Outcome: Progressing   Problem: Skin Integrity: Goal: Risk for impaired skin integrity will decrease Outcome: Progressing   Problem: Education: Goal: Knowledge of General Education information will improve Description: Including pain rating scale, medication(s)/side effects and non-pharmacologic comfort measures Outcome: Not Progressing

## 2021-03-25 NOTE — Telephone Encounter (Signed)
He has opted for hospice. Will close encuonter. HE desats every easily and yes support family  Thanks for your support. Reply not needed  MR

## 2021-03-25 NOTE — Progress Notes (Signed)
PROGRESS NOTE    Roger Sims  TLX:[id number] DOB: [birth year] DOA: 03/21/2021 PCP: Thomes Dinning, MD     Brief Narrative:  Roger Sims is a 75 year old male with past medical history significant for IPF, CAD, hypertension, A. fib on Eliquis.  He has been having decline in lung function, is followed by Dr. Chase Caller for IPF.  He now presented to the emergency department with deterioration in his respiratory status, weakness, oxygen sats 63% at home despite 6 L oxygen use.  Patient was unable to get up to ambulate, and wife could no longer take care of him at home.  Patient initially required high flow oxygen, had very poor prognosis.  PCCM and palliative care medicine were consulted.  Due to decreased LVEF and regional wall motion abnormality cardiology was consulted.  Cardiology did not recommend any further aggressive interventions.  They did start low-dose Lasix.  New events last 24 hours / Subjective: Patient awake and alert, admits to increase in appetite, trying to [place] bedpan. He denies any new symptoms. Awaiting hospice placement.   Assessment & Plan:   Principal Problem:   Acute on chronic respiratory failure with hypoxia (HCC) Active Problems:   IPF (idiopathic pulmonary fibrosis) (HCC)   Chronic respiratory failure with hypoxia (HCC)   Weight loss, unintentional   A-fib (HCC)   DNR (do not resuscitate)   Goals of care, counseling/discussion   Protein calorie malnutrition ([place])   WHO group 3 pulmonary arterial hypertension (HCC)   History of MI (myocardial infarction)   Hyperlipidemia   Shortness of breath   Palliative care status   Caregiver burden   Acute on chronic hypoxemic respiratory failure -Oxygen sat of 63% on 6 L O2, initially required high flow/nonrebreather mask.  Remains on 7-8 L today. -Procalcitonin negative, stop antibiotics -Respiratory viral panel, MRSA PCR, COVID negative -Appreciate PCCM  Acute diastolic CHF -BNP 741.6  -Echocardiogram EF 40  to 45%, regional wall motion abnormality of left ventricle, septal hypokinesis, interventricular septum is flattened in systole and diastole, consistent with right ventricular pressure and volume overload -Appreciate cardiology, and Dr. Jacalyn Lefevre echocardiogram reviewed, he feels that patient's ejection fraction is 50 to 55% range, RV dysfunction secondary to pulmonary disease.  He is not a good candidate for invasive evaluation, family has decided not to pursue aggressive procedure such as heart cath -Continue Lasix  IPF -Continue Prednisone  -Appreciate PCCM  A. fib -Continue Eliquis, bystolic   DVT prophylaxis:   apixaban (ELIQUIS) tablet 5 mg  Code Status:     Code Status Orders  (From admission, onward)         Start     Ordered   03/22/21 0048  Do not attempt resuscitation (DNR)  Continuous       Question Answer Comment  In the event of cardiac or respiratory ARREST Do not call a "code blue"   In the event of cardiac or respiratory ARREST Do not perform Intubation, CPR, defibrillation or ACLS   In the event of cardiac or respiratory ARREST Use medication by any route, position, wound care, and other measures to relive pain and suffering. May use oxygen, suction and manual treatment of airway obstruction as needed for comfort.      03/22/21 0047        Code Status History    Date Active Date Inactive Code Status Order ID Comments User Context   03/22/2021 0017 03/22/2021 0047 DNR [id number]  [name], DO ED   Advance Care Planning  Activity     Family Communication: No family at bedside Disposition Plan:  Status is: Inpatient  Remains inpatient appropriate because:Unsafe d/c plan   Dispo: The patient is from: Home              Anticipated d/c is to: Residential hospice              Patient currently is medically stable to d/c. Awaiting residential hospice placement.    Difficult to place patient No      Consultants:   PCCM  Palliative care  medicine  Cardiology   Antimicrobials:  Anti-infectives (From admission, onward)   Start     Dose/Rate Route Frequency Ordered Stop   03/22/21 0600  ceFEPIme (MAXIPIME) 2 g in sodium chloride 0.9 % 100 mL IVPB  Status:  Discontinued        2 g 200 mL/hr over 30 Minutes Intravenous Every 8 hours 03/22/21 0315 03/23/21 0941   03/22/21 0400  azithromycin (ZITHROMAX) 500 mg in sodium chloride 0.9 % 250 mL IVPB  Status:  Discontinued        500 mg 250 mL/hr over 60 Minutes Intravenous Every 24 hours 03/22/21 0314 03/23/21 0941   03/21/21 2200  ceFEPIme (MAXIPIME) 2 g in sodium chloride 0.9 % 100 mL IVPB        2 g 200 mL/hr over 30 Minutes Intravenous  Once 03/21/21 2154 03/22/21 0039       Objective: Vitals:   03/24/21 0533 03/24/21 2020 03/25/21 0520 03/25/21 0825  BP: 113/69 (!) 151/95 114/62 (!) 91/56  Pulse: 68 85 (!) 107 73  Resp: _0 Temp: 98.2 F (36.8 C) 97.9 F (36.6 C) 98.7 F (37.1 C) (!) 97.5 F (36.4 C)  TempSrc:    Oral  SpO2: 90% 96% 92% 90%  Weight:      Height:        Intake/Output Summary (Last 24 hours) at 03/25/2021 1207 Last data filed at 03/25/2021 0836 Gross per 24 hour  Intake 120 ml  Output 1075 ml  Net -955 ml   Filed Weights   03/22/21 1943  Weight: 74 kg    Examination: General exam: Appears calm and comfortable  Respiratory system: Clear to auscultation anteriorly. Respiratory effort normal. Cardiovascular system: S1 & S2 heard, RRR. No pedal edema. Gastrointestinal system: Abdomen is nondistended, soft and nontender. Normal bowel sounds heard. Central nervous system: Alert and oriented Extremities: Symmetric in appearance bilaterally  Skin: No rashes, lesions or ulcers on exposed skin  Psychiatry: Judgement and insight appear stable  Data Reviewed: I have personally reviewed following labs and imaging studies  CBC: Recent Labs  Lab 03/21/21 2047 03/21/21 2138 03/22/21 0214  WBC 14.3*  --  14.0*  NEUTROABS 12.4*  --   --    HGB 16.4 16.7 16.3  HCT 49.4 49.0 49.5  MCV 94.5  --  94.5  PLT 129*  --  PLATELET CLUMPS NOTED ON SMEAR, UNABLE TO ESTIMATE   Basic Metabolic Panel: Recent Labs  Lab 03/21/21 2047 03/21/21 2138 03/22/21 0214 03/23/21 0408  NA 138 142 140 141  K 5.7* 3.6 3.8 3.7  CL 106  --  109 102  CO2 24  --  22 27  GLUCOSE 108*  --  101* 130*  BUN 22  --  20 24*  CREATININE 0.96  --  0.87 0.95  CALCIUM 8.6*  --  8.7* 8.9   GFR: Estimated Creatinine Clearance: 70.3 mL/min (by C-G formula  based on SCr of 0.95 mg/dL). Liver Function Tests: Recent Labs  Lab 03/21/21 2047  AST 68*  ALT 28  ALKPHOS 70  BILITOT 3.7*  PROT 5.6*  ALBUMIN 2.9*   No results for input(s): LIPASE, AMYLASE in the last 168 hours. No results for input(s): AMMONIA in the last 168 hours. Coagulation Profile: No results for input(s): INR, PROTIME in the last 168 hours. Cardiac Enzymes: No results for input(s): CKTOTAL, CKMB, CKMBINDEX, TROPONINI in the last 168 hours. BNP (last 3 results) No results for input(s): PROBNP in the last 8760 hours. HbA1C: No results for input(s): HGBA1C in the last 72 hours. CBG: No results for input(s): GLUCAP in the last 168 hours. Lipid Profile: No results for input(s): CHOL, HDL, LDLCALC, TRIG, CHOLHDL, LDLDIRECT in the last 72 hours. Thyroid Function Tests: No results for input(s): TSH, T4TOTAL, FREET4, T3FREE, THYROIDAB in the last 72 hours. Anemia Panel: No results for input(s): VITAMINB12, FOLATE, FERRITIN, TIBC, IRON, RETICCTPCT in the last 72 hours. Sepsis Labs: Recent Labs  Lab 03/22/21 0214 03/23/21 0408  PROCALCITON <0.10 <0.10    Recent Results (from the past 240 hour(s))  Resp Panel by RT-PCR (Flu A&B, Covid) Nasopharyngeal Swab     Status: None   Collection Time: 03/21/21 11:41 PM   Specimen: Nasopharyngeal Swab; Nasopharyngeal(NP) swabs in vial transport medium  Result Value Ref Range Status   SARS Coronavirus 2 by RT PCR NEGATIVE NEGATIVE Final     Comment: (NOTE) SARS-CoV-2 target nucleic acids are NOT DETECTED.  The SARS-CoV-2 RNA is generally detectable in upper respiratory specimens during the acute phase of infection. The lowest concentration of SARS-CoV-2 viral copies this assay can detect is 138 copies/mL. A negative result does not preclude SARS-Cov-2 infection and should not be used as the sole basis for treatment or other patient management decisions. A negative result may occur with  improper specimen collection/handling, submission of specimen other than nasopharyngeal swab, presence of viral mutation(s) within the areas targeted by this assay, and inadequate number of viral copies(<138 copies/mL). A negative result must be combined with clinical observations, patient history, and epidemiological information. The expected result is Negative.  Fact Sheet for Patients:  EntrepreneurPulse.com.au  Fact Sheet for Healthcare Providers:  IncredibleEmployment.be  This test is no t yet approved or cleared by the [place] FDA and  has been authorized for detection and/or diagnosis of SARS-CoV-2 by FDA under an Emergency Use Authorization (EUA). This EUA will remain  in effect (meaning this test can be used) for the duration of the COVID-19 declaration under Section 564(b)(1) of the Act, 21 U.S.C.section 360bbb-3(b)(1), unless the authorization is terminated  or revoked sooner.       Influenza A by PCR NEGATIVE NEGATIVE Final   Influenza B by PCR NEGATIVE NEGATIVE Final    Comment: (NOTE) The Xpert Xpress SARS-CoV-2/FLU/RSV plus assay is intended as an aid in the diagnosis of influenza from Nasopharyngeal swab specimens and should not be used as a sole basis for treatment. Nasal washings and aspirates are unacceptable for Xpert Xpress SARS-CoV-2/FLU/RSV testing.  Fact Sheet for Patients: EntrepreneurPulse.com.au  Fact Sheet for Healthcare  Providers: IncredibleEmployment.be  This test is not yet approved or cleared by the [place] FDA and has been authorized for detection and/or diagnosis of SARS-CoV-2 by FDA under an Emergency Use Authorization (EUA). This EUA will remain in effect (meaning this test can be used) for the duration of the COVID-19 declaration under Section 564(b)(1) of the Act, 21 U.S.C. section 360bbb-3(b)(1), unless  the authorization is terminated or revoked.  Performed at [place] [facility], [place] [address] [place], [place] [address]   MRSA PCR Screening     Status: None   Collection Time: 03/22/21  4:14 AM   Specimen: Nasopharyngeal  Result Value Ref Range Status   MRSA by PCR NEGATIVE NEGATIVE Final    Comment:        The GeneXpert MRSA Assay (FDA approved for NASAL specimens only), is one component of a comprehensive MRSA colonization surveillance program. It is not intended to diagnose MRSA infection nor to guide or monitor treatment for MRSA infections. Performed at [facility], [place] [address], [place], [place] [address]   Respiratory (~20 pathogens) panel by PCR     Status: None   Collection Time: 03/22/21 10:50 AM   Specimen: Nasopharyngeal Swab; Respiratory  Result Value Ref Range Status   Adenovirus NOT DETECTED NOT DETECTED Final   Coronavirus 229E NOT DETECTED NOT DETECTED Final    Comment: (NOTE) The Coronavirus on the Respiratory Panel, DOES NOT test for the novel  Coronavirus (2019 nCoV)    Coronavirus HKU1 NOT DETECTED NOT DETECTED Final   Coronavirus NL63 NOT DETECTED NOT DETECTED Final   Coronavirus OC43 NOT DETECTED NOT DETECTED Final   Metapneumovirus NOT DETECTED NOT DETECTED Final   Rhinovirus / Enterovirus NOT DETECTED NOT DETECTED Final   Influenza A NOT DETECTED NOT DETECTED Final   Influenza B NOT DETECTED NOT DETECTED Final   Parainfluenza Virus 1 NOT DETECTED NOT DETECTED Final   Parainfluenza Virus 2 NOT DETECTED NOT DETECTED  Final   Parainfluenza Virus 3 NOT DETECTED NOT DETECTED Final   Parainfluenza Virus 4 NOT DETECTED NOT DETECTED Final   Respiratory Syncytial Virus NOT DETECTED NOT DETECTED Final   Bordetella pertussis NOT DETECTED NOT DETECTED Final   Bordetella Parapertussis NOT DETECTED NOT DETECTED Final   Chlamydophila pneumoniae NOT DETECTED NOT DETECTED Final   Mycoplasma pneumoniae NOT DETECTED NOT DETECTED Final    Comment: Performed at [facility], [place]. [address] [place],  [address]      Radiology Studies: No results found.    Scheduled Meds: . apixaban  5 mg Oral BID  . furosemide  40 mg Oral Daily  . latanoprost  1 drop Both Eyes QHS  . nebivolol  10 mg Oral Daily  . predniSONE  50 mg Oral Q breakfast  . sodium chloride flush  3 mL Intravenous Q12H   Continuous Infusions: . sodium chloride       LOS: 3 days      Time spent: 15 minutes   Dessa Phi, DO [facility] 03/25/2021, 12:07 PM   Available via Epic secure chat 7am-7pm After these hours, please refer to coverage provider listed on [email or web address]

## 2021-03-26 DIAGNOSIS — Z636 Dependent relative needing care at home: Secondary | ICD-10-CM

## 2021-03-26 MED ORDER — HALOPERIDOL LACTATE 5 MG/ML IJ SOLN
0.2500 mg | Freq: Four times a day (QID) | INTRAMUSCULAR | Status: DC | PRN
  Administered 2021-03-27: 0.25 mg via INTRAVENOUS
  Filled 2021-03-26: qty 1

## 2021-03-26 MED ORDER — MORPHINE SULFATE (PF) 2 MG/ML IV SOLN
2.0000 mg | INTRAVENOUS | Status: DC
  Administered 2021-03-26 – 2021-03-27 (×5): 2 mg via INTRAVENOUS
  Filled 2021-03-26 (×6): qty 1

## 2021-03-26 MED ORDER — GLYCOPYRROLATE 0.2 MG/ML IJ SOLN: 0.2000 mg | INTRAMUSCULAR | Status: DC | PRN

## 2021-03-26 MED ORDER — ALPRAZOLAM 0.25 MG PO TABS: 0.2500 mg | ORAL_TABLET | Freq: Three times a day (TID) | ORAL | Status: DC | PRN

## 2021-03-26 NOTE — [place] Notes (Signed)
Report called  

## 2021-03-26 NOTE — Progress Notes (Signed)
Chart has been reviewed by this Hospice nurse and presented to Dr. Edilia Bo, MD.  Pt is taking routine medications orally without difficulty. He does not have a symptom burden that requires IV medication intervention. He is eating meals at 75%.  He does not meet GIP criteria today. We will continue to monitor his status daily. Feel free to call Hospice at [phone] with any questions.  Lin Landsman, RN BSN [place].

## 2021-03-26 NOTE — Progress Notes (Signed)
Patient arrive to unit without cellphone.

## 2021-03-26 NOTE — Progress Notes (Addendum)
Pt wife requested to speak with nursing and stated that she received a call from hospice (was not quite sure whom) stating pt did not qualify for hospice at this time due to patient eating and taking oral medications ok. Pt wife stated that she is in the midst of packing and had bought a condo that she is supposed to move to in about 2 1/2 weeks and does not know what to do. She states she does not know how she would get them in house and is not equipped to take care of pt. Pt wife informed can speak with social worker or if needs to speak with someone at this time can request doctor. Pallitive nurse walked into room and spoke with patients wife. Pt begin to express concerns and cry stating she just recently lost her mother as well. Pt wife begin to breathe rapidly and stated she is having a panic attack. Pt wife when appropriate was able to join staff in consultation room and pt wife was able to take home medication as stated she has anxiety, panic attacks and has had a nervous break down in the past. Pallitive nurse discussed potential placement with a re-evaluation ,plan of care with patient wife discussed including not increasing pt oxygen. Pt wife stated who increased pt's oxygen? Patient oxygen was previously increased by wife prior to this shift as this nurse was the one who recommended to wife to inform staff for any oxygen adjustments. Communication sent to MD and pallitive nurse r/t order from pallitive NP for no oxygen increase and not to check pt O2 sats d/t pt being a DNR and Med-surg status and request to update pt status to comfort care. MD stated at this time pt is not full comfort care and medications are still being administered.

## 2021-03-26 NOTE — Progress Notes (Signed)
Daily Progress Note   Patient Name: Roger Sims       Date: 03/26/2021 DOB: January 08, 1946  Age: 75 y.o. MRN#: [id number] Attending Physician: Dessa Phi, DO Primary Care Physician: Thomes Dinning, MD Admit Date: 03/21/2021  Reason for Consultation/Follow-up: Establishing goals of care  Subjective: Patient required haldol last night for agitation.  Reports shoulder pain today. His breathing is also looking a bit labored. Oxygen has been titrated up. He required some IV haldol last night for agitation.  Spouse at bedside is concerned about his discomfort and increasing agitation. We discussed starting some schedule IV morphine to prevent discomfort, increase comfort, and help with his breathing. He cannot move even in bed without becoming dyspneic.    Length of Stay: 4  Current Medications: Scheduled Meds:  . apixaban  5 mg Oral BID  . furosemide  40 mg Oral Daily  . latanoprost  1 drop Both Eyes QHS  .  morphine injection  2 mg Intravenous Q4H  . nebivolol  10 mg Oral Daily  . sodium chloride flush  3 mL Intravenous Q12H    Continuous Infusions: . sodium chloride      PRN Meds: sodium chloride, acetaminophen **OR** acetaminophen, ALPRAZolam, haloperidol **OR** haloperidol **OR** haloperidol lactate, LORazepam, morphine injection, ondansetron **OR** ondansetron (ZOFRAN) IV, sodium chloride flush  Physical Exam Vitals and nursing note reviewed.             Vital Signs: BP (!) 145/105 (BP Location: Right Arm)   Pulse 76   Temp 97.8 F (36.6 C)   Resp 17   Ht 5' 11" (1.803 m)   Wt 74 kg   SpO2 94%   BMI 22.75 kg/m  SpO2: SpO2: 94 % O2 Device: O2 Device: High Flow Nasal Cannula O2 Flow Rate: O2 Flow Rate (L/min): 8 L/min  Intake/output summary:   Intake/Output  Summary (Last 24 hours) at 03/26/2021 1347 Last data filed at 03/26/2021 1108 Gross per 24 hour  Intake 480 ml  Output 975 ml  Net -495 ml   LBM: Last BM Date: 03/25/21 Baseline Weight: Weight: 74 kg Most recent weight: Weight: 74 kg       Palliative Assessment/Data: PPS: 20%       Patient Active Problem List   Diagnosis Date Noted  . A-fib ([place]) 03/22/2021  . DNR (  do not resuscitate) 03/22/2021  . Goals of care, counseling/discussion 03/22/2021  . Shortness of breath 03/22/2021  . Palliative care status 03/22/2021  . Caregiver burden 03/22/2021  . Hyperlipidemia   . Acute on chronic respiratory failure with hypoxia ([place]) 03/21/2021  . Protein calorie malnutrition ([place]) 03/21/2021  . 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 IPF (idiopathic pulmonary fibrosis) ([place]) 09/15/2020  . Pulmonary emphysema ([place]) 09/15/2020  . Chronic respiratory failure with hypoxia ([place]) 09/15/2020  . Weight loss, unintentional 09/15/2020  . Healthcare maintenance 09/15/2020  . History of MI (myocardial infarction) 03/22/2010    Palliative Care Assessment & Plan   Patient Profile:  75 y.o.malewith past medical history of idiopathic pulmonary fibrosis, CAD, hypertension, and trail fibrillation on Eliquis. He presented to the emergency departmenton 5/30/2022with shortness of breath.Reporting 2 day acute deterioration - inability to ambulate, severe shortness of breath, and O2 saturation of 63% at home despite 6 liters of oxygen.  ED Course: BNP 613, WBC 14k. Chest x-ray shows ground glass opacities superimposed on top of his chronic IPF findings; radiologist suggests superimposed pneumonia or pulmonary edema. Admitted to [facility] with acute on chronic respiratory failure.  Assessment/Recommendations/Plan  Continue comfort measures only Awaiting residential Hospice bed in hospice of the [place] in [facility] Will schedule 46m IV morphine to increase his comfort, please do  not increase oxygen- give prn morphine for dyspnea  Goals of Care and Additional Recommendations: Limitations on Scope of Treatment: Full Comfort Care and Minimize Medications  Code Status: DNR  Prognosis:  < 2 weeks  Discharge Planning: Hospice facility  Care plan was discussed with patient's spouse and nurse  Thank you for allowing the Palliative Medicine Team to assist in the care of this patient.   Total time: 39 minutes  Greater than 50%  of this time was spent counseling and coordinating care related to the above assessment and plan.  KMariana Kaufman AGNP-C Palliative Medicine   Please contact Palliative Medicine Team phone at [phone] questions and concerns.

## 2021-03-26 NOTE — Progress Notes (Addendum)
Additional encounter time:   Noted note from [place] RN that patient was not eligible for inpatient hospice today.  Zannie C[place] and she stated patient will be re-evaluated tomorrow- she does feel he will be eligible as he declines and requires IV medication for symptom management.  Went to room to discuss with patient's spouse and she was in discussion about this matter with RN.  During their discussion patient's spouse became SOB, hyperventilating and verbalized having panic attack. She was assisted to conference room by Kandy Garrison, [place] and myself. Remained with Otila Kluver, she took her medication for panic disorder.  Her breathing stabilized, however, she was very disoriented and could not retain information related to her location, how she got in the conference room, and why she was at the hospital. I was able to reach her daughter, Kennyth Lose who came to hospital. Kennyth Lose noted that after very bad panic attacks- Otila Kluver can remain disoriented for several hours.  I offered to escort Otila Kluver to emergency room for evaluation. Kennyth Lose and Otila Kluver both declined and felt emergency services were not felt to be needed at this time.  Otila Kluver was left with Kennyth Lose and she will assist in getting [place] home.  Kennyth Lose will serve as main contact for patient going forward.   Mariana Kaufman, AGNP-C Palliative Medicine  Additional time: 46 minutes

## 2021-03-26 NOTE — Progress Notes (Signed)
Daughter called and updated on patient location. Pt family was present during the day/no cell phone was noted utilized by patient each shift by this nurse.

## 2021-03-26 NOTE — Progress Notes (Signed)
PROGRESS NOTE    Roger Sims  OAC:[id number] DOB: 06-05-1946 DOA: 03/21/2021 PCP: Thomes Dinning, MD     Brief Narrative:  Roger Sims is a 75 year old male with past medical history significant for IPF, CAD, hypertension, A. fib on Eliquis.  He has been having decline in lung function, is followed by Dr. Chase Caller for IPF.  He now presented to the emergency department with deterioration in his respiratory status, weakness, oxygen sats 63% at home despite 6 L oxygen use.  Patient was unable to get up to ambulate, and wife could no longer take care of him at home.  Patient initially required high flow oxygen, had very poor prognosis.  PCCM and palliative care medicine were consulted.  Due to decreased LVEF and regional wall motion abnormality cardiology was consulted.  Cardiology did not recommend any further aggressive interventions.  They did start low-dose Lasix.  New events last 24 hours / Subjective: Patient on 9 L nasal cannula O2 today.  Wife states that she would really like patient to transfer to residential hospice today if available.  Assessment & Plan:   Principal Problem:   Acute on chronic respiratory failure with hypoxia (HCC) Active Problems:   IPF (idiopathic pulmonary fibrosis) (HCC)   Chronic respiratory failure with hypoxia (HCC)   Weight loss, unintentional   A-fib (HCC)   DNR (do not resuscitate)   Goals of care, counseling/discussion   Protein calorie malnutrition ([place])   WHO group 3 pulmonary arterial hypertension (HCC)   History of MI (myocardial infarction)   Hyperlipidemia   Shortness of breath   Palliative care status   Caregiver burden   Acute on chronic hypoxemic respiratory failure -Oxygen sat of 63% on 6 L O2, initially required high flow/nonrebreather mask.  Remains on 9 L today. -Procalcitonin negative, stop antibiotics -Respiratory viral panel, MRSA PCR, COVID negative -Appreciate PCCM  Acute diastolic CHF -BNP 010.9  -Echocardiogram  EF 40 to 45%, regional wall motion abnormality of left ventricle, septal hypokinesis, interventricular septum is flattened in systole and diastole, consistent with right ventricular pressure and volume overload -Appreciate cardiology, and Dr. Jacalyn Lefevre echocardiogram reviewed, he feels that patient's ejection fraction is 50 to 55% range, RV dysfunction secondary to pulmonary disease.  He is not a good candidate for invasive evaluation, family has decided not to pursue aggressive procedure such as heart cath -Continue Lasix  IPF -Continue Prednisone  -Appreciate PCCM  A. fib -Continue Eliquis, bystolic   DVT prophylaxis:   apixaban (ELIQUIS) tablet 5 mg  Code Status:     Code Status Orders  (From admission, onward)         Start     Ordered   03/22/21 0048  Do not attempt resuscitation (DNR)  Continuous       Question Answer Comment  In the event of cardiac or respiratory ARREST Do not call a "code blue"   In the event of cardiac or respiratory ARREST Do not perform Intubation, CPR, defibrillation or ACLS   In the event of cardiac or respiratory ARREST Use medication by any route, position, wound care, and other measures to relive pain and suffering. May use oxygen, suction and manual treatment of airway obstruction as needed for comfort.      03/22/21 0047        Code Status History    Date Active Date Inactive Code Status Order ID Comments User Context   03/22/2021 0017 03/22/2021 0047 DNR [id number]  [name], DO ED  Advance Care Planning Activity     Family Communication: Wife at bedside Disposition Plan:  Status is: Inpatient  Remains inpatient appropriate because:Unsafe d/c plan   Dispo: The patient is from: Home              Anticipated d/c is to: Residential hospice              Patient currently is medically stable to d/c. Awaiting residential hospice placement.    Difficult to place patient No      Consultants:   PCCM  Palliative care  medicine  Cardiology   Antimicrobials:  Anti-infectives (From admission, onward)   Start     Dose/Rate Route Frequency Ordered Stop   03/22/21 0600  ceFEPIme (MAXIPIME) 2 g in sodium chloride 0.9 % 100 mL IVPB  Status:  Discontinued        2 g 200 mL/hr over 30 Minutes Intravenous Every 8 hours 03/22/21 0315 03/23/21 0941   03/22/21 0400  azithromycin (ZITHROMAX) 500 mg in sodium chloride 0.9 % 250 mL IVPB  Status:  Discontinued        500 mg 250 mL/hr over 60 Minutes Intravenous Every 24 hours 03/22/21 0314 03/23/21 0941   03/21/21 2200  ceFEPIme (MAXIPIME) 2 g in sodium chloride 0.9 % 100 mL IVPB        2 g 200 mL/hr over 30 Minutes Intravenous  Once 03/21/21 2154 03/22/21 0039       Objective: Vitals:   03/25/21 0825 03/25/21 2038 03/26/21 0638 03/26/21 0806  BP: (!) 91/56 (!) 148/98 (!) 147/99 (!) 145/84  Pulse: 73 67 73 72  Resp:  _0 Temp: (!) 97.5 F (36.4 C) 97.7 F (36.5 C) 97.8 F (36.6 C) 97.6 F (36.4 C)  TempSrc: Oral Oral Axillary   SpO2: 90% 91% 95% 96%  Weight:      Height:        Intake/Output Summary (Last 24 hours) at 03/26/2021 1001 Last data filed at 03/26/2021 0844 Gross per 24 hour  Intake 480 ml  Output 575 ml  Net -95 ml   Filed Weights   03/22/21 1943  Weight: 74 kg    Examination: General exam: Appears calm and comfortable  Respiratory system: Clear to auscultation anteriorly. Respiratory effort normal.  On 9 l oxygen Cardiovascular system: S1 & S2 heard, RRR. No pedal edema. Gastrointestinal system: Abdomen is nondistended, soft and nontender. Normal bowel sounds heard. Central nervous system: Alert and oriented Extremities: Symmetric in appearance bilaterally  Skin: No rashes, lesions or ulcers on exposed skin  Psychiatry: Judgement and insight appear stable. Mood & affect appropriate.    Data Reviewed: I have personally reviewed following labs and imaging studies  CBC: Recent Labs  Lab 03/21/21 2047 03/21/21 2138  03/22/21 0214  WBC 14.3*  --  14.0*  NEUTROABS 12.4*  --   --   HGB 16.4 16.7 16.3  HCT 49.4 49.0 49.5  MCV 94.5  --  94.5  PLT 129*  --  PLATELET CLUMPS NOTED ON SMEAR, UNABLE TO ESTIMATE   Basic Metabolic Panel: Recent Labs  Lab 03/21/21 2047 03/21/21 2138 03/22/21 0214 03/23/21 0408  NA 138 142 140 141  K 5.7* 3.6 3.8 3.7  CL 106  --  109 102  CO2 24  --  22 27  GLUCOSE 108*  --  101* 130*  BUN 22  --  20 24*  CREATININE 0.96  --  0.87 0.95  CALCIUM 8.6*  --  8.7* 8.9   GFR: Estimated Creatinine Clearance: 70.3 mL/min (by C-G formula based on SCr of 0.95 mg/dL). Liver Function Tests: Recent Labs  Lab 03/21/21 2047  AST 68*  ALT 28  ALKPHOS 70  BILITOT 3.7*  PROT 5.6*  ALBUMIN 2.9*   No results for input(s): LIPASE, AMYLASE in the last 168 hours. No results for input(s): AMMONIA in the last 168 hours. Coagulation Profile: No results for input(s): INR, PROTIME in the last 168 hours. Cardiac Enzymes: No results for input(s): CKTOTAL, CKMB, CKMBINDEX, TROPONINI in the last 168 hours. BNP (last 3 results) No results for input(s): PROBNP in the last 8760 hours. HbA1C: No results for input(s): HGBA1C in the last 72 hours. CBG: No results for input(s): GLUCAP in the last 168 hours. Lipid Profile: No results for input(s): CHOL, HDL, LDLCALC, TRIG, CHOLHDL, LDLDIRECT in the last 72 hours. Thyroid Function Tests: No results for input(s): TSH, T4TOTAL, FREET4, T3FREE, THYROIDAB in the last 72 hours. Anemia Panel: No results for input(s): VITAMINB12, FOLATE, FERRITIN, TIBC, IRON, RETICCTPCT in the last 72 hours. Sepsis Labs: Recent Labs  Lab 03/22/21 0214 03/23/21 0408  PROCALCITON <0.10 <0.10    Recent Results (from the past 240 hour(s))  Resp Panel by RT-PCR (Flu A&B, Covid) Nasopharyngeal Swab     Status: None   Collection Time: 03/21/21 11:41 PM   Specimen: Nasopharyngeal Swab; Nasopharyngeal(NP) swabs in vial transport medium  Result Value Ref Range  Status   SARS Coronavirus 2 by RT PCR NEGATIVE NEGATIVE Final    Comment: (NOTE) SARS-CoV-2 target nucleic acids are NOT DETECTED.  The SARS-CoV-2 RNA is generally detectable in upper respiratory specimens during the acute phase of infection. The lowest concentration of SARS-CoV-2 viral copies this assay can detect is 138 copies/mL. A negative result does not preclude SARS-Cov-2 infection and should not be used as the sole basis for treatment or other patient management decisions. A negative result may occur with  improper specimen collection/handling, submission of specimen other than nasopharyngeal swab, presence of viral mutation(s) within the areas targeted by this assay, and inadequate number of viral copies(<138 copies/mL). A negative result must be combined with clinical observations, patient history, and epidemiological information. The expected result is Negative.  Fact Sheet for Patients:  EntrepreneurPulse.com.au  Fact Sheet for Healthcare Providers:  IncredibleEmployment.be  This test is no t yet approved or cleared by the [place] FDA and  has been authorized for detection and/or diagnosis of SARS-CoV-2 by FDA under an Emergency Use Authorization (EUA). This EUA will remain  in effect (meaning this test can be used) for the duration of the COVID-19 declaration under Section 564(b)(1) of the Act, 21 U.S.C.section 360bbb-3(b)(1), unless the authorization is terminated  or revoked sooner.       Influenza A by PCR NEGATIVE NEGATIVE Final   Influenza B by PCR NEGATIVE NEGATIVE Final    Comment: (NOTE) The Xpert Xpress SARS-CoV-2/FLU/RSV plus assay is intended as an aid in the diagnosis of influenza from Nasopharyngeal swab specimens and should not be used as a sole basis for treatment. Nasal washings and aspirates are unacceptable for Xpert Xpress SARS-CoV-2/FLU/RSV testing.  Fact Sheet for  Patients: EntrepreneurPulse.com.au  Fact Sheet for Healthcare Providers: IncredibleEmployment.be  This test is not yet approved or cleared by the [place] FDA and has been authorized for detection and/or diagnosis of SARS-CoV-2 by FDA under an Emergency Use Authorization (EUA). This EUA will remain in effect (meaning this test can be used) for the duration of the  COVID-19 declaration under Section 564(b)(1) of the Act, 21 U.S.C. section 360bbb-3(b)(1), unless the authorization is terminated or revoked.  Performed at [facility], [place] [address], [place], [place] [address]   MRSA PCR Screening     Status: None   Collection Time: 03/22/21  4:14 AM   Specimen: Nasopharyngeal  Result Value Ref Range Status   MRSA by PCR NEGATIVE NEGATIVE Final    Comment:        The GeneXpert MRSA Assay (FDA approved for NASAL specimens only), is one component of a comprehensive MRSA colonization surveillance program. It is not intended to diagnose MRSA infection nor to guide or monitor treatment for MRSA infections. Performed at [facility], [place] [address], [place], [place] [address]   Respiratory (~20 pathogens) panel by PCR     Status: None   Collection Time: 03/22/21 10:50 AM   Specimen: Nasopharyngeal Swab; Respiratory  Result Value Ref Range Status   Adenovirus NOT DETECTED NOT DETECTED Final   Coronavirus 229E NOT DETECTED NOT DETECTED Final    Comment: (NOTE) The Coronavirus on the Respiratory Panel, DOES NOT test for the novel  Coronavirus (2019 nCoV)    Coronavirus HKU1 NOT DETECTED NOT DETECTED Final   Coronavirus NL63 NOT DETECTED NOT DETECTED Final   Coronavirus OC43 NOT DETECTED NOT DETECTED Final   Metapneumovirus NOT DETECTED NOT DETECTED Final   Rhinovirus / Enterovirus NOT DETECTED NOT DETECTED Final   Influenza A NOT DETECTED NOT DETECTED Final   Influenza B NOT DETECTED NOT DETECTED Final   Parainfluenza Virus 1  NOT DETECTED NOT DETECTED Final   Parainfluenza Virus 2 NOT DETECTED NOT DETECTED Final   Parainfluenza Virus 3 NOT DETECTED NOT DETECTED Final   Parainfluenza Virus 4 NOT DETECTED NOT DETECTED Final   Respiratory Syncytial Virus NOT DETECTED NOT DETECTED Final   Bordetella pertussis NOT DETECTED NOT DETECTED Final   Bordetella Parapertussis NOT DETECTED NOT DETECTED Final   Chlamydophila pneumoniae NOT DETECTED NOT DETECTED Final   Mycoplasma pneumoniae NOT DETECTED NOT DETECTED Final    Comment: Performed at [facility], [place]. [address], [place], [place] [address]      Radiology Studies: No results found.    Scheduled Meds: . apixaban  5 mg Oral BID  . furosemide  40 mg Oral Daily  . latanoprost  1 drop Both Eyes QHS  . nebivolol  10 mg Oral Daily  . predniSONE  50 mg Oral Q breakfast  . sodium chloride flush  3 mL Intravenous Q12H   Continuous Infusions: . sodium chloride       LOS: 4 days      Time spent: 15 minutes   Dessa Phi, DO [facility] 03/26/2021, 10:01 AM   Available via Epic secure chat 7am-7pm After these hours, please refer to coverage provider listed on [email or web address]

## 2021-03-26 NOTE — Progress Notes (Signed)
6N called to give report. Left number for nurse to call back when available.

## 2021-03-27 DIAGNOSIS — I5031 Acute diastolic (congestive) heart failure: Secondary | ICD-10-CM

## 2021-03-27 DIAGNOSIS — Z515 Encounter for palliative care: Secondary | ICD-10-CM

## 2021-03-27 MED ORDER — LIP MEDEX EX OINT
TOPICAL_OINTMENT | CUTANEOUS | Status: DC | PRN
  Filled 2021-03-27: qty 7

## 2021-03-27 NOTE — Discharge Summary (Signed)
Physician Discharge Summary  Roger Sims IRJ:[id number] DOB: Feb 24, 1946 DOA: 03/21/2021  PCP: Thomes Dinning, MD  Admit date: 03/21/2021 Discharge date: 03/27/2021  Admitted From: Home Disposition:  Hospice   Discharge Condition: Terminal CODE STATUS: DNR  Diet recommendation: Comfort   Brief/Interim Summary: Roger Sims is a 75 year old male with past medical history significant for IPF, CAD, hypertension, A. fib on Eliquis.  He has been having decline in lung function, is followed by Dr. Chase Caller for IPF.  He now presented to the emergency department with deterioration in his respiratory status, weakness, oxygen sats 63% at home despite 6 L oxygen use.  Patient was unable to get up to ambulate, and wife could no longer take care of him at home.  Patient initially required high flow oxygen, had very poor prognosis.  PCCM and palliative care medicine were consulted.    He was started on steroids.  Due to decreased LVEF and regional wall motion abnormality cardiology was consulted.  Cardiology did not recommend any further aggressive interventions.  They did start low-dose Lasix.  Patient continued to deteriorate, requiring high levels of oxygen up to 9 L.  Due to his grim prognosis, family decided to pursue comfort care and hospice.  Discharge Diagnoses:  Principal Problem:   Acute on chronic respiratory failure with hypoxia (HCC) Active Problems:   IPF (idiopathic pulmonary fibrosis) (HCC)   Chronic respiratory failure with hypoxia (HCC)   Weight loss, unintentional   A-fib ([place])   DNR (do not resuscitate)   Goals of care, counseling/discussion   Protein calorie malnutrition ([place])   WHO group 3 pulmonary arterial hypertension (HCC)   History of MI (myocardial infarction)   Hyperlipidemia   Shortness of breath   Palliative care status   Caregiver burden   Acute diastolic CHF (congestive heart failure) ([place])   End of life care   Discharge Instructions   Allergies as of  03/27/2021      Reactions   Penicillins Swelling   Tetracyclines & Related Anaphylaxis   Gi upset Gi upset   Metoprolol Tartrate Rash   itching itching   Prasugrel Rash      Medication List    STOP taking these medications   ALPRAZolam 0.25 MG tablet Commonly known as: Xanax   Eliquis 5 MG Tabs tablet Generic drug: apixaban   furosemide 20 MG tablet Commonly known as: LASIX   guaiFENesin 600 MG 12 hr tablet Commonly known as: MUCINEX   latanoprost 0.005 % ophthalmic solution Commonly known as: XALATAN   nebivolol 10 MG tablet Commonly known as: BYSTOLIC   rosuvastatin 20 MG tablet Commonly known as: CRESTOR       Allergies  Allergen Reactions  . Penicillins Swelling  . Tetracyclines & Related Anaphylaxis    Gi upset Gi upset   . Metoprolol Tartrate Rash    itching itching   . Prasugrel Rash    Consultations:  PCCM  Palliative care medicine  Cardiology     Procedures/Studies: DG Chest 2 View  Result Date: 03/21/2021 CLINICAL DATA:  Lethargy, generalized weakness, dyspnea on exertion, history of fibrosis EXAM: CHEST - 2 VIEW COMPARISON:  04/21/2020 FINDINGS: Frontal and lateral views of the chest demonstrate mild enlargement of the cardiac silhouette, which may be related to positioning and AP technique. Diffuse fibrotic changes are seen throughout the lungs, with extensive superimposed bilateral ground-glass airspace disease. Trace bilateral effusions. No pneumothorax. IMPRESSION: 1. Background pulmonary fibrosis, with superimposed ground-glass opacities compatible with edema or multifocal pneumonia. 2.  Trace bilateral pleural effusions. Electronically Signed   By: Randa Ngo M.D.   On: 03/21/2021 21:29   ECHOCARDIOGRAM COMPLETE  Result Date: 03/22/2021    ECHOCARDIOGRAM REPORT   Patient Name:   Roger Sims Date of Exam: 03/22/2021 Medical Rec #:  [id number]     Height:       72.0 in Accession #:    [id number]    Weight:       161.0 lb Date of  Birth:  May 27, 1946     BSA:          1.943 m Patient Age:    39 years      BP:           148/104 mmHg Patient Gender: M             HR:           84 bpm. Exam Location:  Inpatient Procedure: 2D Echo Indications:    acute diastolic chf  History:        Patient has no prior history of Echocardiogram examinations.                 CAD, Arrythmias:Atrial Fibrillation, Signs/Symptoms:Shortness of                 Breath; Risk Factors:Hypertension and Dyslipidemia.  Sonographer:    Johny Chess Referring Phys: [phone] JARED M GARDNER  Sonographer Comments: Image acquisition challenging due to respiratory motion. IMPRESSIONS  1. Left ventricular ejection fraction, by estimation, is 40 to 45%. The left ventricle has mildly decreased function. The left ventricle demonstrates regional wall motion abnormalities. Septal hypokinesis. There is mild left ventricular hypertrophy. There is the interventricular septum is flattened in systole and diastole, consistent with right ventricular pressure and volume overload. Left ventricular diastolic parameters are indeterminate.  2. Right ventricular systolic function is moderately reduced. The right ventricular size is moderately enlarged. There is moderately elevated pulmonary artery systolic pressure. The estimated right ventricular systolic pressure is 44.8 mmHg.  3. Right atrial size was severely dilated.  4. The mitral valve is normal in structure. Trivial mitral valve regurgitation.  5. Tricuspid valve regurgitation is moderate.  6. The aortic valve is tricuspid. Aortic valve regurgitation is trivial. Mild to moderate aortic valve sclerosis/calcification is present, without any evidence of aortic stenosis.  7. The inferior vena cava is normal in size with <50% respiratory variability, suggesting right atrial pressure of 8 mmHg. FINDINGS  Left Ventricle: Left ventricular ejection fraction, by estimation, is 40 to 45%. The left ventricle has mildly decreased function. The left  ventricle demonstrates regional wall motion abnormalities. The left ventricular internal cavity size was normal in size. There is mild left ventricular hypertrophy. The interventricular septum is flattened in systole and diastole, consistent with right ventricular pressure and volume overload. Left ventricular diastolic parameters are indeterminate. Right Ventricle: The right ventricular size is moderately enlarged. Right vetricular wall thickness was not well visualized. Right ventricular systolic function is moderately reduced. There is moderately elevated pulmonary artery systolic pressure. The tricuspid regurgitant velocity is 3.46 m/s, and with an assumed right atrial pressure of 8 mmHg, the estimated right ventricular systolic pressure is 18.5 mmHg. Left Atrium: Left atrial size was normal in size. Right Atrium: Right atrial size was severely dilated. Pericardium: There is no evidence of pericardial effusion. Mitral Valve: The mitral valve is normal in structure. Trivial mitral valve regurgitation. Tricuspid Valve: The tricuspid valve is normal in structure. Tricuspid valve regurgitation is moderate. Aortic  Valve: The aortic valve is tricuspid. Aortic valve regurgitation is trivial. Mild to moderate aortic valve sclerosis/calcification is present, without any evidence of aortic stenosis. Pulmonic Valve: The pulmonic valve was grossly normal. Pulmonic valve regurgitation is trivial. Aorta: The aortic root and ascending aorta are structurally normal, with no evidence of dilitation. Venous: The inferior vena cava is normal in size with less than 50% respiratory variability, suggesting right atrial pressure of 8 mmHg. IAS/Shunts: The interatrial septum was not well visualized.  LEFT VENTRICLE PLAX 2D LVIDd:         4.10 cm LVIDs:         3.30 cm LV PW:         1.00 cm LV IVS:        1.10 cm LVOT diam:     2.00 cm LV SV:         28 LV SV Index:   14 LVOT Area:     3.14 cm  RIGHT VENTRICLE            IVC RV Basal  diam:  4.50 cm    IVC diam: 1.90 cm RV Mid diam:    4.40 cm RV S prime:     7.62 cm/s TAPSE (M-mode): 1.2 cm LEFT ATRIUM             Index       RIGHT ATRIUM           Index LA diam:        4.20 cm 2.16 cm/m  RA Area:     29.20 cm LA Vol (A2C):   46.3 ml 23.83 ml/m RA Volume:   109.00 ml 56.10 ml/m LA Vol (A4C):   60.0 ml 30.88 ml/m LA Biplane Vol: 56.8 ml 29.23 ml/m  AORTIC VALVE LVOT Vmax:   49.70 cm/s LVOT Vmean:  32.100 cm/s LVOT VTI:    0.089 m  AORTA Ao Root diam: 2.90 cm Ao Asc diam:  2.90 cm TRICUSPID VALVE TR Peak grad:   47.9 mmHg TR Vmax:        346.00 cm/s  SHUNTS Systemic VTI:  0.09 m Systemic Diam: 2.00 cm Oswaldo Milian MD Electronically signed by Oswaldo Milian MD Signature Date/Time: 03/22/2021/8:06:18 PM    Final         Discharge Exam: Vitals:   03/26/21 1930 03/27/21 0325  BP: (!) 164/101 (!) 151/94  Pulse: 70 66  Resp: 18 16  Temp: (!) 97.1 F (36.2 C) 97.7 F (36.5 C)  SpO2: 93% 94%     General: Pt is alert, awake, confused, delirious Cardiovascular: RRR, S1/S2 +, no edema Respiratory: CTA bilaterally anteriorly, on room air, patient had taken off nasal cannula O2 Abdominal: Soft, NT, ND Extremities: no edema, no cyanosis Psych: Confused   The results of significant diagnostics from this hospitalization (including imaging, microbiology, ancillary and laboratory) are listed below for reference.     Microbiology: Recent Results (from the past 240 hour(s))  Resp Panel by RT-PCR (Flu A&B, Covid) Nasopharyngeal Swab     Status: None   Collection Time: 03/21/21 11:41 PM   Specimen: Nasopharyngeal Swab; Nasopharyngeal(NP) swabs in vial transport medium  Result Value Ref Range Status   SARS Coronavirus 2 by RT PCR NEGATIVE NEGATIVE Final    Comment: (NOTE) SARS-CoV-2 target nucleic acids are NOT DETECTED.  The SARS-CoV-2 RNA is generally detectable in upper respiratory specimens during the acute phase of infection. The lowest concentration of  SARS-CoV-2 viral copies this assay can detect  is 138 copies/mL. A negative result does not preclude SARS-Cov-2 infection and should not be used as the sole basis for treatment or other patient management decisions. A negative result may occur with  improper specimen collection/handling, submission of specimen other than nasopharyngeal swab, presence of viral mutation(s) within the areas targeted by this assay, and inadequate number of viral copies(<138 copies/mL). A negative result must be combined with clinical observations, patient history, and epidemiological information. The expected result is Negative.  Fact Sheet for Patients:  EntrepreneurPulse.com.au  Fact Sheet for Healthcare Providers:  IncredibleEmployment.be  This test is no t yet approved or cleared by the [place] FDA and  has been authorized for detection and/or diagnosis of SARS-CoV-2 by FDA under an Emergency Use Authorization (EUA). This EUA will remain  in effect (meaning this test can be used) for the duration of the COVID-19 declaration under Section 564(b)(1) of the Act, 21 U.S.C.section 360bbb-3(b)(1), unless the authorization is terminated  or revoked sooner.       Influenza A by PCR NEGATIVE NEGATIVE Final   Influenza B by PCR NEGATIVE NEGATIVE Final    Comment: (NOTE) The Xpert Xpress SARS-CoV-2/FLU/RSV plus assay is intended as an aid in the diagnosis of influenza from Nasopharyngeal swab specimens and should not be used as a sole basis for treatment. Nasal washings and aspirates are unacceptable for Xpert Xpress SARS-CoV-2/FLU/RSV testing.  Fact Sheet for Patients: EntrepreneurPulse.com.au  Fact Sheet for Healthcare Providers: IncredibleEmployment.be  This test is not yet approved or cleared by the [place] FDA and has been authorized for detection and/or diagnosis of SARS-CoV-2 by FDA under an Emergency Use  Authorization (EUA). This EUA will remain in effect (meaning this test can be used) for the duration of the COVID-19 declaration under Section 564(b)(1) of the Act, 21 U.S.C. section 360bbb-3(b)(1), unless the authorization is terminated or revoked.  Performed at [facility], [place] [address], [place], [place] [address]   MRSA PCR Screening     Status: None   Collection Time: 03/22/21  4:14 AM   Specimen: Nasopharyngeal  Result Value Ref Range Status   MRSA by PCR NEGATIVE NEGATIVE Final    Comment:        The GeneXpert MRSA Assay (FDA approved for NASAL specimens only), is one component of a comprehensive MRSA colonization surveillance program. It is not intended to diagnose MRSA infection nor to guide or monitor treatment for MRSA infections. Performed at [facility], [place] [address] [place], [place] [address]   Respiratory (~20 pathogens) panel by PCR     Status: None   Collection Time: 03/22/21 10:50 AM   Specimen: Nasopharyngeal Swab; Respiratory  Result Value Ref Range Status   Adenovirus NOT DETECTED NOT DETECTED Final   Coronavirus 229E NOT DETECTED NOT DETECTED Final    Comment: (NOTE) The Coronavirus on the Respiratory Panel, DOES NOT test for the novel  Coronavirus (2019 nCoV)    Coronavirus HKU1 NOT DETECTED NOT DETECTED Final   Coronavirus NL63 NOT DETECTED NOT DETECTED Final   Coronavirus OC43 NOT DETECTED NOT DETECTED Final   Metapneumovirus NOT DETECTED NOT DETECTED Final   Rhinovirus / Enterovirus NOT DETECTED NOT DETECTED Final   Influenza A NOT DETECTED NOT DETECTED Final   Influenza B NOT DETECTED NOT DETECTED Final   Parainfluenza Virus 1 NOT DETECTED NOT DETECTED Final   Parainfluenza Virus 2 NOT DETECTED NOT DETECTED Final   Parainfluenza Virus 3 NOT DETECTED NOT DETECTED Final   Parainfluenza Virus  4 NOT DETECTED NOT DETECTED Final   Respiratory Syncytial Virus NOT DETECTED NOT DETECTED Final   Bordetella pertussis NOT DETECTED NOT  DETECTED Final   Bordetella Parapertussis NOT DETECTED NOT DETECTED Final   Chlamydophila pneumoniae NOT DETECTED NOT DETECTED Final   Mycoplasma pneumoniae NOT DETECTED NOT DETECTED Final    Comment: Performed at [facility], [place] [address], [place], [place] [address]     Labs: BNP (last 3 results) Recent Labs    03/21/21 2047  BNP 299.2*   Basic Metabolic Panel: Recent Labs  Lab 03/21/21 2047 03/21/21 2138 03/22/21 0214 03/23/21 0408  NA 138 142 140 141  K 5.7* 3.6 3.8 3.7  CL 106  --  109 102  CO2 24  --  22 27  GLUCOSE 108*  --  101* 130*  BUN 22  --  20 24*  CREATININE 0.96  --  0.87 0.95  CALCIUM 8.6*  --  8.7* 8.9   Liver Function Tests: Recent Labs  Lab 03/21/21 2047  AST 68*  ALT 28  ALKPHOS 70  BILITOT 3.7*  PROT 5.6*  ALBUMIN 2.9*   No results for input(s): LIPASE, AMYLASE in the last 168 hours. No results for input(s): AMMONIA in the last 168 hours. CBC: Recent Labs  Lab 03/21/21 2047 03/21/21 2138 03/22/21 0214  WBC 14.3*  --  14.0*  NEUTROABS 12.4*  --   --   HGB 16.4 16.7 16.3  HCT 49.4 49.0 49.5  MCV 94.5  --  94.5  PLT 129*  --  PLATELET CLUMPS NOTED ON SMEAR, UNABLE TO ESTIMATE   Cardiac Enzymes: No results for input(s): CKTOTAL, CKMB, CKMBINDEX, TROPONINI in the last 168 hours. BNP: Invalid input(s): POCBNP CBG: No results for input(s): GLUCAP in the last 168 hours. D-Dimer No results for input(s): DDIMER in the last 72 hours. Hgb A1c No results for input(s): HGBA1C in the last 72 hours. Lipid Profile No results for input(s): CHOL, HDL, LDLCALC, TRIG, CHOLHDL, LDLDIRECT in the last 72 hours. Thyroid function studies No results for input(s): TSH, T4TOTAL, T3FREE, THYROIDAB in the last 72 hours.  Invalid input(s): FREET3 Anemia work up No results for input(s): VITAMINB12, FOLATE, FERRITIN, TIBC, IRON, RETICCTPCT in the last 72 hours. Urinalysis    Component Value Date/Time   COLORURINE YELLOW 03/22/2021 0435    APPEARANCEUR CLEAR 03/22/2021 0435   LABSPEC 1.013 03/22/2021 0435   PHURINE 5.0 03/22/2021 0435   GLUCOSEU NEGATIVE 03/22/2021 0435   HGBUR NEGATIVE 03/22/2021 [place] 03/22/2021 0435   KETONESUR NEGATIVE 03/22/2021 0435   PROTEINUR 30 (A) 03/22/2021 0435   NITRITE NEGATIVE 03/22/2021 0435   LEUKOCYTESUR NEGATIVE 03/22/2021 0435   Sepsis Labs Invalid input(s): PROCALCITONIN,  WBC,  LACTICIDVEN Microbiology Recent Results (from the past 240 hour(s))  Resp Panel by RT-PCR (Flu A&B, Covid) Nasopharyngeal Swab     Status: None   Collection Time: 03/21/21 11:41 PM   Specimen: Nasopharyngeal Swab; Nasopharyngeal(NP) swabs in vial transport medium  Result Value Ref Range Status   SARS Coronavirus 2 by RT PCR NEGATIVE NEGATIVE Final    Comment: (NOTE) SARS-CoV-2 target nucleic acids are NOT DETECTED.  The SARS-CoV-2 RNA is generally detectable in upper respiratory specimens during the acute phase of infection. The lowest concentration of SARS-CoV-2 viral copies this assay can detect is 138 copies/mL. A negative result does not preclude SARS-Cov-2 infection and should not be used as the sole basis for treatment or other patient management decisions. A negative  result may occur with  improper specimen collection/handling, submission of specimen other than nasopharyngeal swab, presence of viral mutation(s) within the areas targeted by this assay, and inadequate number of viral copies(<138 copies/mL). A negative result must be combined with clinical observations, patient history, and epidemiological information. The expected result is Negative.  Fact Sheet for Patients:  EntrepreneurPulse.com.au  Fact Sheet for Healthcare Providers:  IncredibleEmployment.be  This test is no t yet approved or cleared by the [place] FDA and  has been authorized for detection and/or diagnosis of SARS-CoV-2 by FDA under an Emergency Use  Authorization (EUA). This EUA will remain  in effect (meaning this test can be used) for the duration of the COVID-19 declaration under Section 564(b)(1) of the Act, 21 U.S.C.section 360bbb-3(b)(1), unless the authorization is terminated  or revoked sooner.       Influenza A by PCR NEGATIVE NEGATIVE Final   Influenza B by PCR NEGATIVE NEGATIVE Final    Comment: (NOTE) The Xpert Xpress SARS-CoV-2/FLU/RSV plus assay is intended as an aid in the diagnosis of influenza from Nasopharyngeal swab specimens and should not be used as a sole basis for treatment. Nasal washings and aspirates are unacceptable for Xpert Xpress SARS-CoV-2/FLU/RSV testing.  Fact Sheet for Patients: EntrepreneurPulse.com.au  Fact Sheet for Healthcare Providers: IncredibleEmployment.be  This test is not yet approved or cleared by the [place] FDA and has been authorized for detection and/or diagnosis of SARS-CoV-2 by FDA under an Emergency Use Authorization (EUA). This EUA will remain in effect (meaning this test can be used) for the duration of the COVID-19 declaration under Section 564(b)(1) of the Act, 21 U.S.C. section 360bbb-3(b)(1), unless the authorization is terminated or revoked.  Performed at [facility], [place] [address] [place], [place] [address]   MRSA PCR Screening     Status: None   Collection Time: 03/22/21  4:14 AM   Specimen: Nasopharyngeal  Result Value Ref Range Status   MRSA by PCR NEGATIVE NEGATIVE Final    Comment:        The GeneXpert MRSA Assay (FDA approved for NASAL specimens only), is one component of a comprehensive MRSA colonization surveillance program. It is not intended to diagnose MRSA infection nor to guide or monitor treatment for MRSA infections. Performed at [facility], [place] [address], [place], [place] [address]   Respiratory (~20 pathogens) panel by PCR     Status: None   Collection Time: 03/22/21 10:50 AM    Specimen: Nasopharyngeal Swab; Respiratory  Result Value Ref Range Status   Adenovirus NOT DETECTED NOT DETECTED Final   Coronavirus 229E NOT DETECTED NOT DETECTED Final    Comment: (NOTE) The Coronavirus on the Respiratory Panel, DOES NOT test for the novel  Coronavirus (2019 nCoV)    Coronavirus HKU1 NOT DETECTED NOT DETECTED Final   Coronavirus NL63 NOT DETECTED NOT DETECTED Final   Coronavirus OC43 NOT DETECTED NOT DETECTED Final   Metapneumovirus NOT DETECTED NOT DETECTED Final   Rhinovirus / Enterovirus NOT DETECTED NOT DETECTED Final   Influenza A NOT DETECTED NOT DETECTED Final   Influenza B NOT DETECTED NOT DETECTED Final   Parainfluenza Virus 1 NOT DETECTED NOT DETECTED Final   Parainfluenza Virus 2 NOT DETECTED NOT DETECTED Final   Parainfluenza Virus 3 NOT DETECTED NOT DETECTED Final   Parainfluenza Virus 4 NOT DETECTED NOT DETECTED Final   Respiratory Syncytial Virus NOT DETECTED NOT DETECTED Final   Bordetella pertussis NOT DETECTED NOT DETECTED Final   Bordetella  Parapertussis NOT DETECTED NOT DETECTED Final   Chlamydophila pneumoniae NOT DETECTED NOT DETECTED Final   Mycoplasma pneumoniae NOT DETECTED NOT DETECTED Final    Comment: Performed at [facility], [place] [address] [place], [place] [address]     Patient was seen and examined on the day of discharge and was found to be in stable condition. Time coordinating discharge: 25 minutes including assessment and coordination of care, as well as examination of the patient.   SIGNED:  Dessa Phi, DO [facility] 03/27/2021, 10:53 AM

## 2021-03-27 NOTE — Progress Notes (Signed)
Palliative:  Mr. Brander is lying quietly in bed.  He appears acutely/chronically ill and frail.  He will briefly make, but not keep, eye contact. I am not sure that he can make his basic needs known.  Daughter, Marita [place] is at bedside. We talk about comfort care and residential hospice.  Marita [place] shares that she understands that he has been accepted to [facility] and will transfer today.    Call to [facility] representative, Tharon Aquas, who shares that Mr. Dogan has been accepted to residential hospice and a bed will be available after 1230 today.    Conference with attending, bedside nursing staff and TOC related to patient condition, needs, GOC, disposition.  Plan:  Full comfort care, residential hospice at [facility] for comfort and dignity at end of life.   9 minute  Quinn Axe, NP Palliative Medicine Team Tem Phone [phone] Greater than 50% of this time was spent counseling and coordinating care related to the above assessment and plan.

## 2021-03-27 NOTE — Progress Notes (Signed)
Upon entering patient's room, writer noted blood on patient's sheets. Noted that patient pulled out his PIV and condom cath. Patient was reoriented, reinserted new PIV to L Hand and PRN Haldol was administered. Offered warm blanket and made comfortable in bed. will continue to close monitor patient.

## 2021-03-27 NOTE — Progress Notes (Signed)
Report was called to Chiropractor at [place]. Pt left with PIV in place and condom cath intact (just changed). [facility] arrived and transported [facility] to [facility]

## 2021-03-27 NOTE — TOC Transition Note (Addendum)
Transition of Care [facility]) - CM/SW Discharge Note   Patient Details  Name: Roger Sims MRN: [id number] Date of Birth: 26-Apr-1946  [facility] of [facility]) CM/SW Contact:  Gabrielle Dare Phone Number: 03/27/2021, 12:03 PM   Clinical Narrative:    Patient will Discharge To: [facility] Date:03/27/21 Family Notified:yes, daughter, Ysidro Evert, [phone] Transport LE:[id number]   Per MD patient ready for DC to [facility] . RN, patient, patient's family, and facility notified of DC. Assessment, Fl2/Pasrr, and Discharge Summary sent to facility. RN given number for report [phone]). DC packet on chart. Ambulance transport requested for patient for 1:00pm.   CSW signing off.  Reed Breech [facility] [phone]     Final next level of care: [facility] ([place]) Barriers to Discharge: No Barriers Identified   Patient Goals and CMS Choice Patient states their goals for this hospitalization and ongoing recovery are:: comfort CMS Medicare.gov Compare Post Acute Care list provided to:: Patient Represenative (must comment) Choice offered to / list presented to : Spouse  Discharge Placement              Patient chooses bed at:  [facility] [place]) Patient to be transferred to facility by: [facility] Name of family member notified: Ysidro Evert Patient and family notified of of transfer: 03/27/21  Discharge Plan and Services In-house Referral: Clinical Social Work   Post Acute Care Choice: Hospice                    HH Arranged:  (Residential Hospice) [place] Date [place]: 03/24/21 Time [place]: 1354 Representative spoke with at [place]: [place] ([place]) Interventions     Readmission Risk Interventions No flowsheet data found.

## 2021-04-13 ENCOUNTER — Other Ambulatory Visit: Payer: Self-pay

## 2021-04-13 ENCOUNTER — Encounter: Payer: Medicare Other | Admitting: Primary Care

## 2021-04-13 ENCOUNTER — Telehealth: Payer: Self-pay | Admitting: Internal Medicine

## 2021-04-13 NOTE — Progress Notes (Signed)
 Err

## 2021-04-13 NOTE — Telephone Encounter (Signed)
Patient's Wife called to let us know that Patient passed away 04-29-21.   Message routed to Dr. Chase Caller as Juluis Rainier

## 2021-04-13 NOTE — Telephone Encounter (Signed)
Pts wife stated that the pt passed away on Apr 27, 2021 and she wanted to inform Dr. Chase Caller of his passing stated she forgot he had an appointment scheduled today. Pls regard; [phone]

## 2021-04-14 NOTE — Telephone Encounter (Signed)
Sorry to hear. Fast decline. Please get me a condolence card

## 2021-04-14 NOTE — Telephone Encounter (Signed)
Card placed next to computer in B pod  Will message Raquel Sarna as FYI as well

## 2021-04-22 DEATH — deceased

## 2022-03-25 IMAGING — DX DG CHEST 2V
2 series · 2 of 2 positions shown · non-contrast
Comparison: 04/21/2020

CLINICAL DATA: Lethargy, generalized weakness, dyspnea on exertion,
history of fibrosis

EXAM:
CHEST - 2 VIEW

[chest lat]
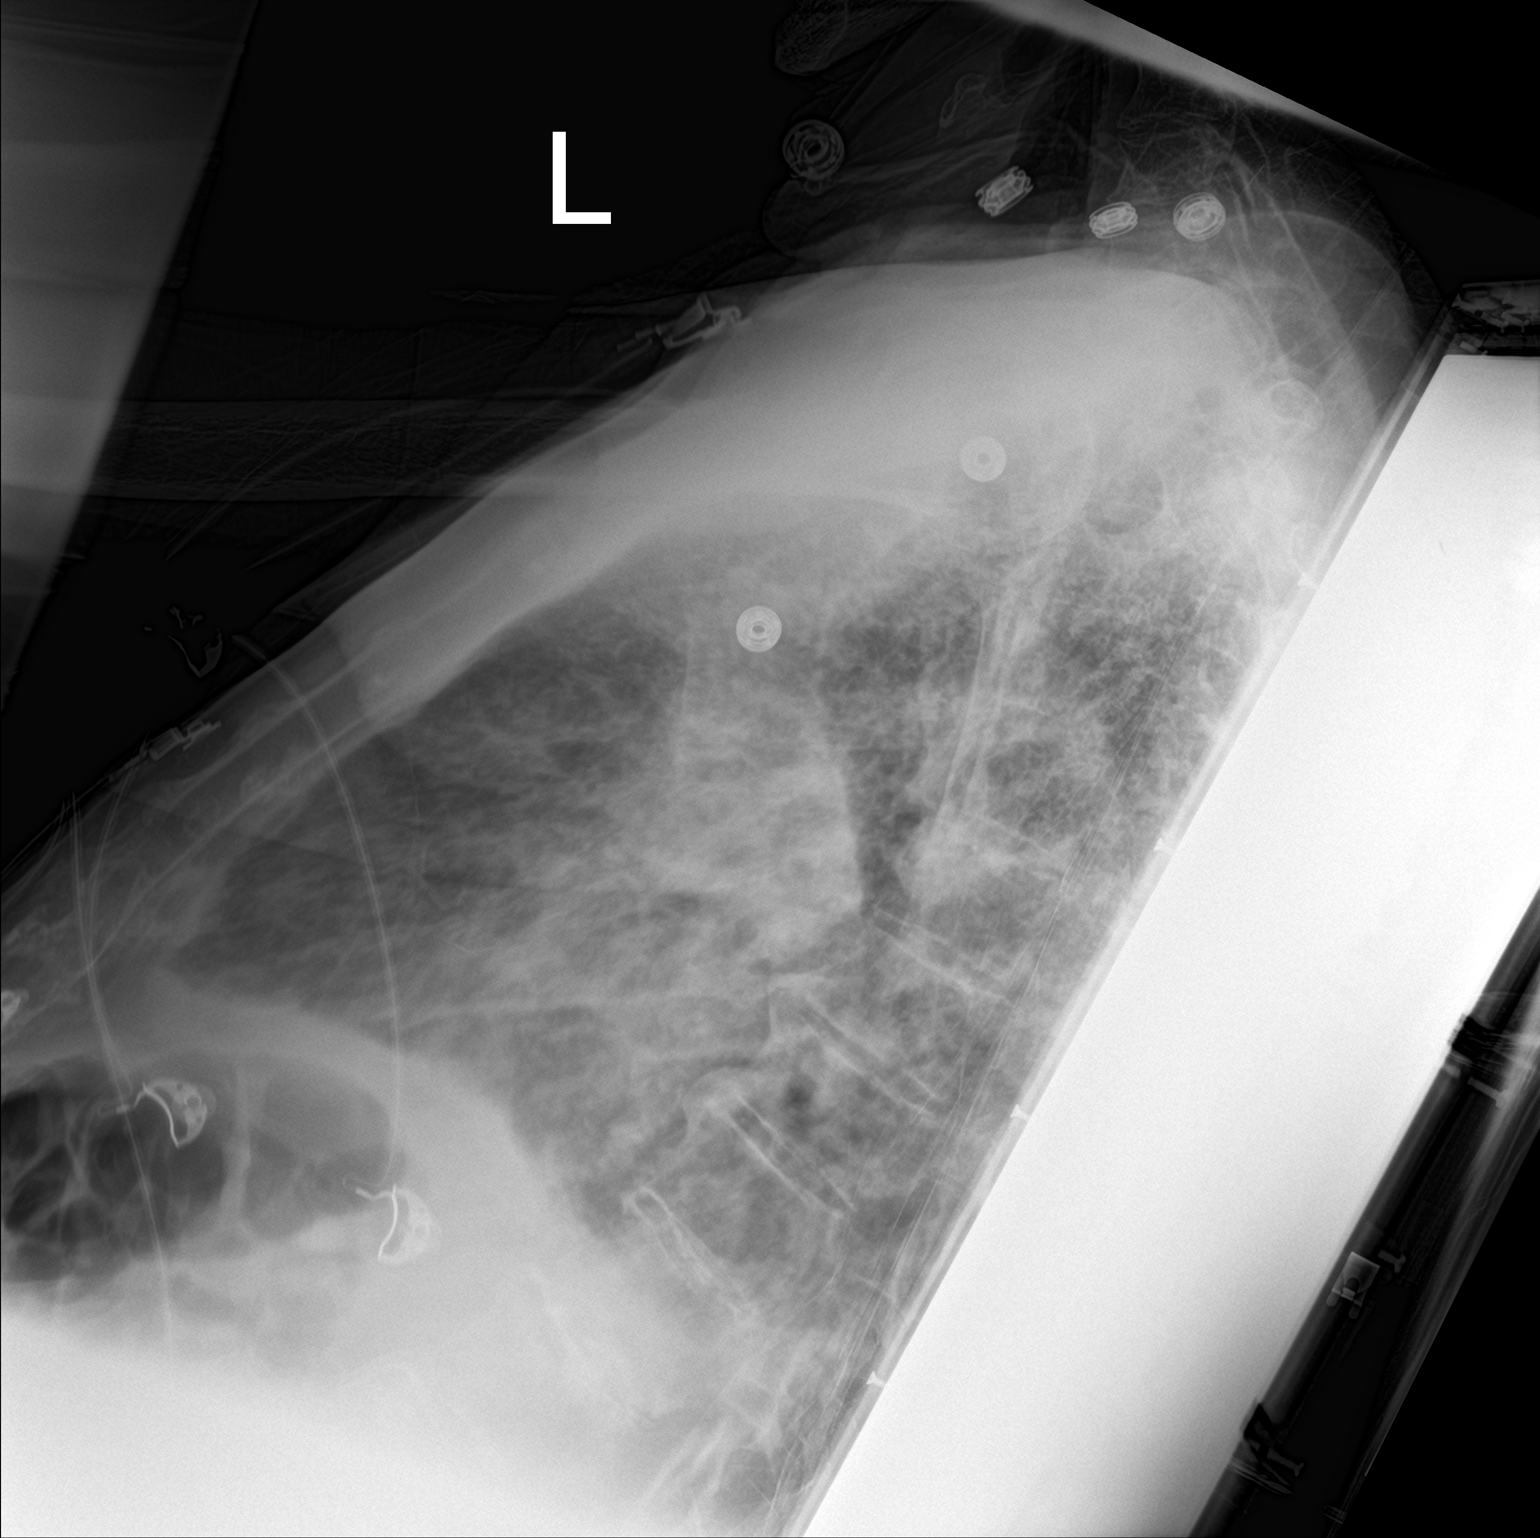

[chest ap]
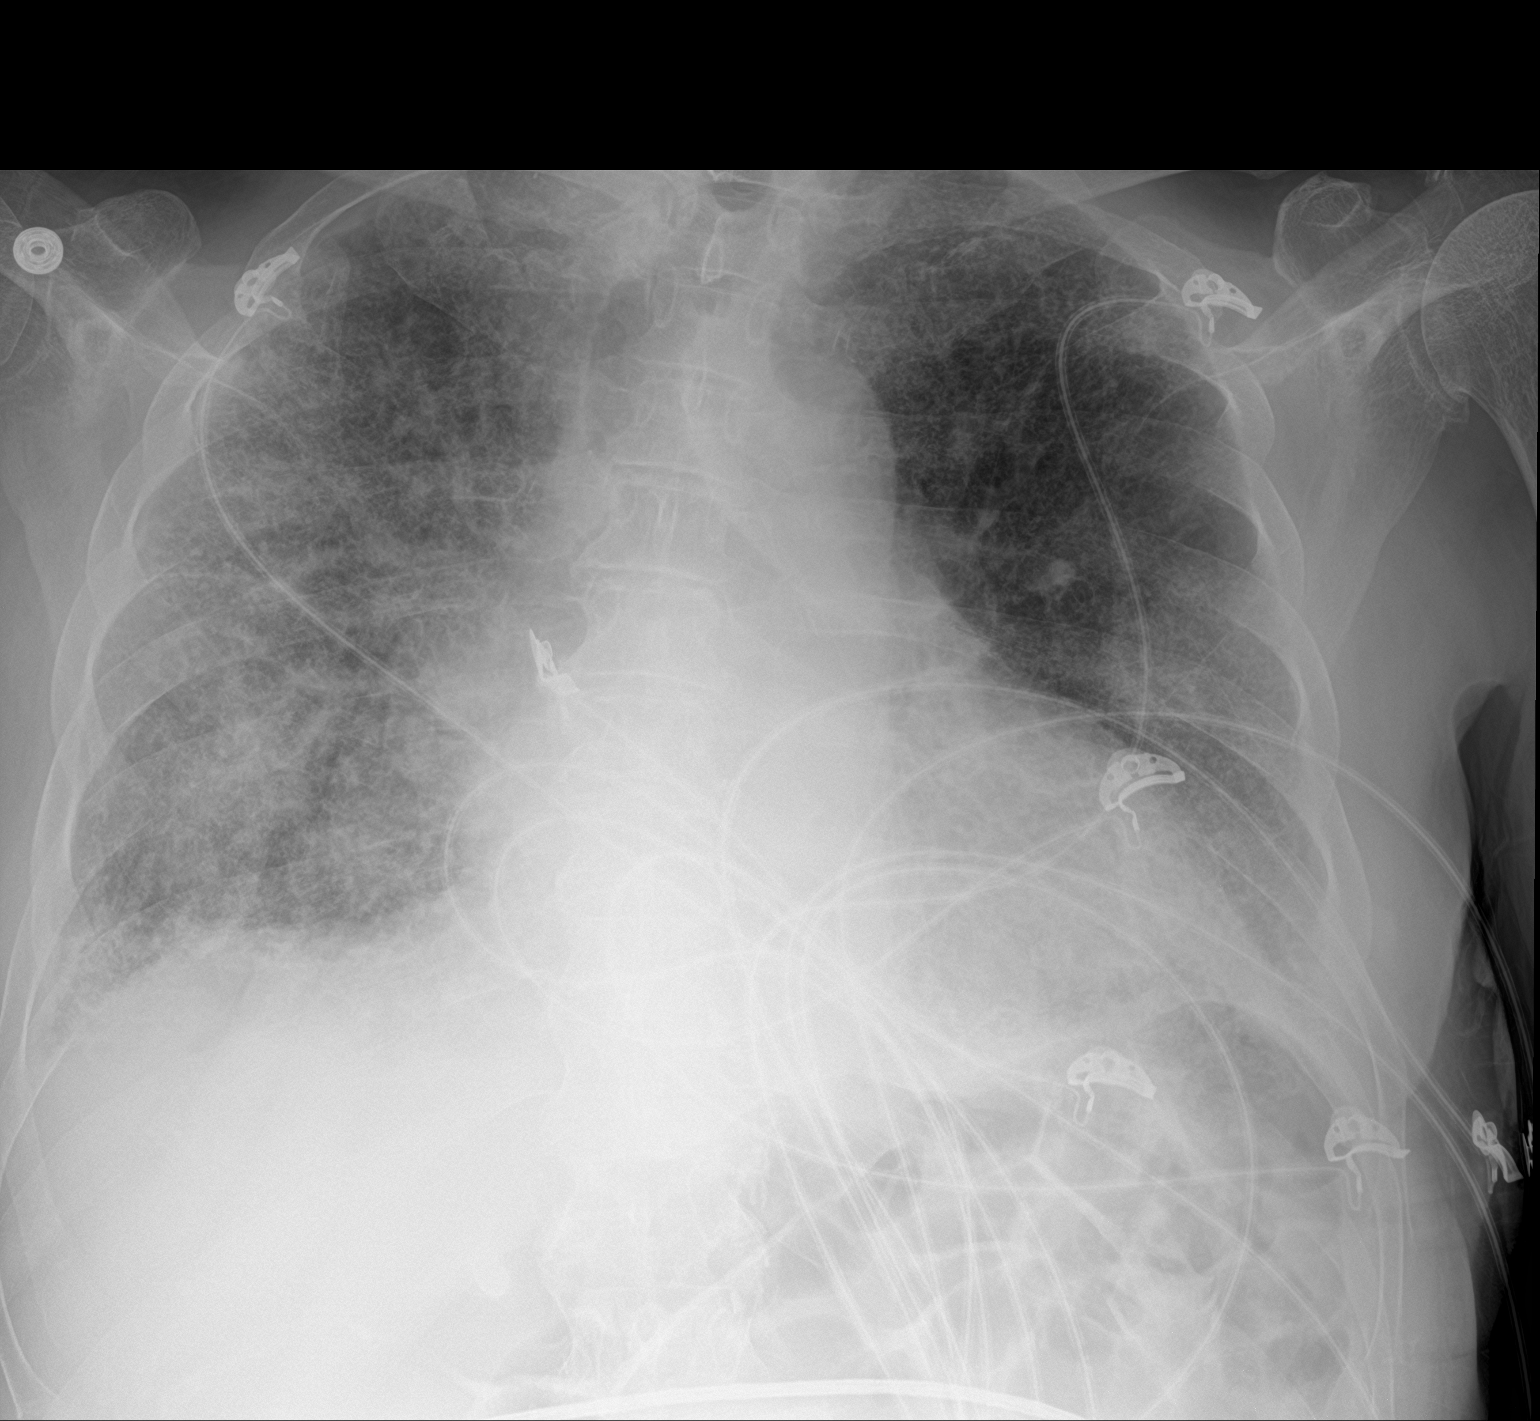

[2 of 2 positions shown; findings below may reference images not displayed]

FINDINGS: Frontal and lateral views of the chest demonstrate mild enlargement
of the cardiac silhouette, which may be related to positioning and
AP technique. Diffuse fibrotic changes are seen throughout the
lungs, with extensive superimposed bilateral ground-glass airspace
disease. Trace bilateral effusions. No pneumothorax.
IMPRESSION: 1. Background pulmonary fibrosis, with superimposed ground-glass
opacities compatible with edema or multifocal pneumonia.
2. Trace bilateral pleural effusions.

## 2024-05-27 NOTE — Progress Notes (Signed)
 This encounter was created in error - please disregard.
